# Patient Record
Sex: Male | Born: 1985 | Hispanic: Refuse to answer | Marital: Married | Smoking: Current some day smoker
Health system: Southern US, Community
[De-identification: ages and names within clinical notes are randomized; demographics above are authoritative.]

## PROBLEM LIST (undated history)

## (undated) DIAGNOSIS — I10 Essential (primary) hypertension: Secondary | ICD-10-CM

## (undated) DIAGNOSIS — E669 Obesity, unspecified: Secondary | ICD-10-CM

## (undated) HISTORY — DX: Obesity, unspecified: E66.9

## (undated) HISTORY — PX: CHOLECYSTECTOMY: SHX55

## (undated) HISTORY — DX: Essential (primary) hypertension: I10

---

## 2006-04-15 ENCOUNTER — Emergency Department (HOSPITAL_COMMUNITY): Admission: EM | Admit: 2006-04-15 | Discharge: 2006-04-15 | Payer: Self-pay | Admitting: Emergency Medicine

## 2010-02-16 ENCOUNTER — Emergency Department (HOSPITAL_COMMUNITY): Admission: EM | Admit: 2010-02-16 | Discharge: 2010-02-16 | Payer: Self-pay | Admitting: Emergency Medicine

## 2010-06-24 ENCOUNTER — Inpatient Hospital Stay (HOSPITAL_COMMUNITY): Admission: EM | Admit: 2010-06-24 | Discharge: 2010-06-28 | Payer: Self-pay | Admitting: Emergency Medicine

## 2010-06-27 ENCOUNTER — Encounter (INDEPENDENT_AMBULATORY_CARE_PROVIDER_SITE_OTHER): Payer: Self-pay | Admitting: General Surgery

## 2010-07-02 ENCOUNTER — Emergency Department (HOSPITAL_COMMUNITY): Admission: EM | Admit: 2010-07-02 | Discharge: 2010-07-02 | Payer: Self-pay | Admitting: Emergency Medicine

## 2011-02-05 LAB — DIFFERENTIAL
Basophils Absolute: 0 10*3/uL (ref 0.0–0.1)
Eosinophils Absolute: 0.1 10*3/uL (ref 0.0–0.7)
Lymphocytes Relative: 15 % (ref 12–46)
Lymphs Abs: 1.8 10*3/uL (ref 0.7–4.0)
Neutrophils Relative %: 78 % — ABNORMAL HIGH (ref 43–77)

## 2011-02-05 LAB — CBC
HCT: 43.2 % (ref 39.0–52.0)
Hemoglobin: 17.2 g/dL — ABNORMAL HIGH (ref 13.0–17.0)
MCH: 29.8 pg (ref 26.0–34.0)
MCHC: 34.2 g/dL (ref 30.0–36.0)
MCV: 89.4 fL (ref 78.0–100.0)
Platelets: 133 10*3/uL — ABNORMAL LOW (ref 150–400)
RBC: 5.23 MIL/uL (ref 4.22–5.81)
RDW: 13.1 % (ref 11.5–15.5)
RDW: 13.2 % (ref 11.5–15.5)
WBC: 11.3 10*3/uL — ABNORMAL HIGH (ref 4.0–10.5)
WBC: 12.4 10*3/uL — ABNORMAL HIGH (ref 4.0–10.5)
WBC: 7.2 10*3/uL (ref 4.0–10.5)

## 2011-02-05 LAB — URINALYSIS, ROUTINE W REFLEX MICROSCOPIC
Bilirubin Urine: NEGATIVE
Glucose, UA: NEGATIVE mg/dL
Hgb urine dipstick: NEGATIVE
Ketones, ur: NEGATIVE mg/dL
Nitrite: NEGATIVE
Protein, ur: NEGATIVE mg/dL
Specific Gravity, Urine: 1.01 (ref 1.005–1.030)
Urobilinogen, UA: 1 mg/dL (ref 0.0–1.0)
pH: 5.5 (ref 5.0–8.0)

## 2011-02-05 LAB — COMPREHENSIVE METABOLIC PANEL
ALT: 352 U/L — ABNORMAL HIGH (ref 0–53)
ALT: 462 U/L — ABNORMAL HIGH (ref 0–53)
Albumin: 4.1 g/dL (ref 3.5–5.2)
Alkaline Phosphatase: 119 U/L — ABNORMAL HIGH (ref 39–117)
BUN: 10 mg/dL (ref 6–23)
BUN: 9 mg/dL (ref 6–23)
CO2: 27 mEq/L (ref 19–32)
Calcium: 8.8 mg/dL (ref 8.4–10.5)
Calcium: 9 mg/dL (ref 8.4–10.5)
Calcium: 9.4 mg/dL (ref 8.4–10.5)
Creatinine, Ser: 0.73 mg/dL (ref 0.4–1.5)
Creatinine, Ser: 0.84 mg/dL (ref 0.4–1.5)
GFR calc non Af Amer: >60 mL/min (ref >60)
Glucose, Bld: 146 mg/dL — ABNORMAL HIGH (ref 70–99)
Potassium: 3.9 mEq/L (ref 3.5–5.1)
Potassium: 4 mEq/L (ref 3.5–5.1)
Sodium: 138 mEq/L (ref 135–145)
Sodium: 140 mEq/L (ref 135–145)
Total Bilirubin: 1 mg/dL (ref 0.3–1.2)
Total Bilirubin: 3.1 mg/dL — ABNORMAL HIGH (ref 0.3–1.2)
Total Protein: 6 g/dL (ref 6.0–8.3)
Total Protein: 6.4 g/dL (ref 6.0–8.3)

## 2011-02-05 LAB — HEPATIC FUNCTION PANEL
ALT: 136 U/L — ABNORMAL HIGH (ref 0–53)
ALT: 188 U/L — ABNORMAL HIGH (ref 0–53)
AST: 39 U/L — ABNORMAL HIGH (ref 0–37)
AST: 49 U/L — ABNORMAL HIGH (ref 0–37)
Albumin: 3.7 g/dL (ref 3.5–5.2)
Alkaline Phosphatase: 82 U/L (ref 39–117)
Alkaline Phosphatase: 97 U/L (ref 39–117)
Bilirubin, Direct: <0.1 mg/dL (ref 0.0–0.3)
Indirect Bilirubin: 0.9 mg/dL (ref 0.3–0.9)
Total Bilirubin: 0.4 mg/dL (ref 0.3–1.2)
Total Protein: 6.4 g/dL (ref 6.0–8.3)

## 2011-02-05 LAB — AMYLASE: Amylase: 229 U/L — ABNORMAL HIGH (ref 0–105)

## 2011-02-05 LAB — LIPID PANEL
HDL: 37 mg/dL — ABNORMAL LOW (ref >39)
Total CHOL/HDL Ratio: 3.5 RATIO
Triglycerides: 104 mg/dL (ref <150)
VLDL: 21 mg/dL (ref 0–40)

## 2011-02-05 LAB — LIPASE, BLOOD
Lipase: 6506 U/L — ABNORMAL HIGH (ref 11–59)
Lipase: 81 U/L — ABNORMAL HIGH (ref 11–59)

## 2011-02-05 LAB — GLUCOSE, CAPILLARY: Glucose-Capillary: 92 mg/dL (ref 70–99)

## 2011-02-15 LAB — DIFFERENTIAL
Basophils Absolute: 0 10*3/uL (ref 0.0–0.1)
Basophils Relative: 0 % (ref 0–1)
Lymphocytes Relative: 29 % (ref 12–46)
Monocytes Absolute: 0.6 10*3/uL (ref 0.1–1.0)
Neutro Abs: 6.7 10*3/uL (ref 1.7–7.7)
Neutrophils Relative %: 64 % (ref 43–77)

## 2011-02-15 LAB — CBC
HCT: 48.2 % (ref 39.0–52.0)
Hemoglobin: 16.4 g/dL (ref 13.0–17.0)
MCV: 90.2 fL (ref 78.0–100.0)
Platelets: 162 10*3/uL (ref 150–400)
RDW: 13.4 % (ref 11.5–15.5)

## 2011-02-15 LAB — COMPREHENSIVE METABOLIC PANEL
Albumin: 4.2 g/dL (ref 3.5–5.2)
BUN: 17 mg/dL (ref 6–23)
Chloride: 102 mEq/L (ref 96–112)
Creatinine, Ser: 0.88 mg/dL (ref 0.4–1.5)
Glucose, Bld: 114 mg/dL — ABNORMAL HIGH (ref 70–99)
Total Bilirubin: 0.6 mg/dL (ref 0.3–1.2)
Total Protein: 7.5 g/dL (ref 6.0–8.3)

## 2011-02-15 LAB — URINALYSIS, ROUTINE W REFLEX MICROSCOPIC
Glucose, UA: NEGATIVE mg/dL
Hgb urine dipstick: NEGATIVE
Ketones, ur: NEGATIVE mg/dL
Protein, ur: NEGATIVE mg/dL

## 2011-10-28 IMAGING — CT CT ABD-PELV W/ CM
2 of 4 series · 16 of 46 positions shown, 18 images · IV contrast (water & 100ml omni 300)
Comparison: Ultrasound abdomen of 02/16/2010

CLINICAL DATA: Right upper quadrant pain, fever, nausea and
vomiting

CT ABDOMEN AND PELVIS WITH CONTRAST
TECHNIQUE: Multidetector CT imaging of the abdomen and pelvis was
performed following the standard protocol during bolus
administration of intravenous contrast.
Contrast: 100 ml Fmnipaque-4RR

[Series 2: routine abdomen · axial · 0.88mm/px · z∈[-459,+6]mm · 13 of 103 slices shown, 15 images]
[im 5/103  soft-tissue]
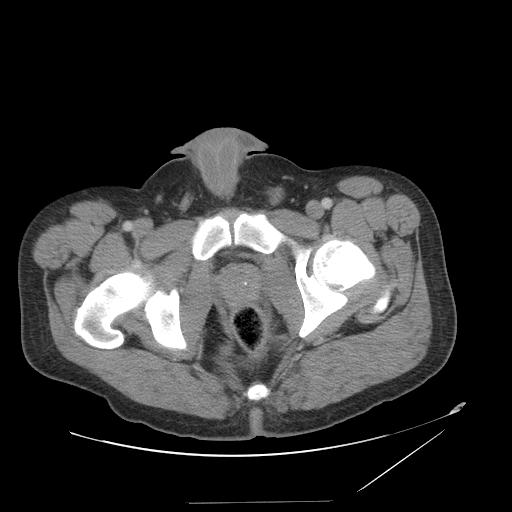
[im 5/103  bone]
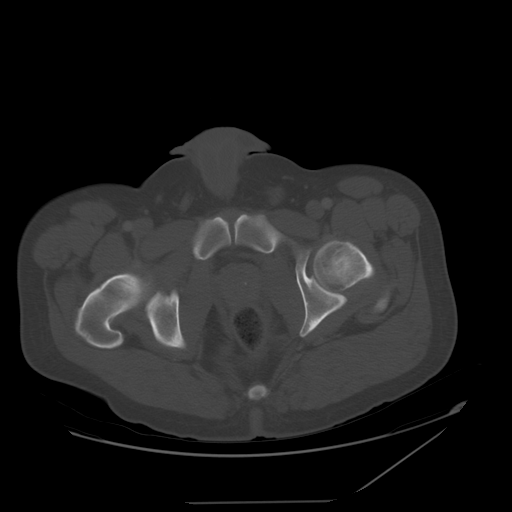
[im 14/103  soft-tissue]
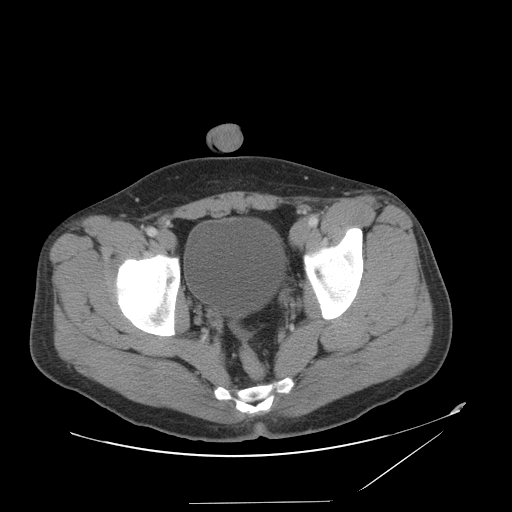
[im 23/103  soft-tissue]
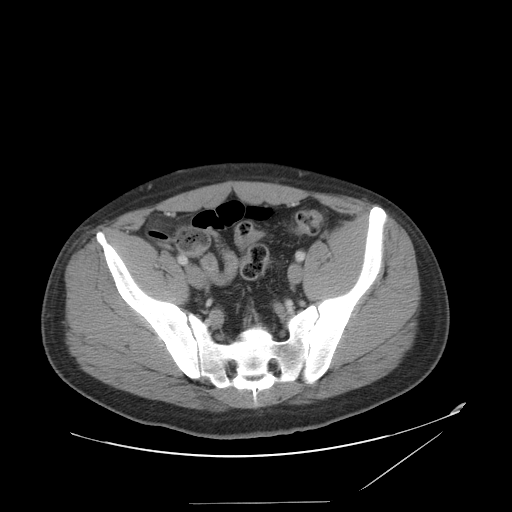
[im 27/103  soft-tissue]
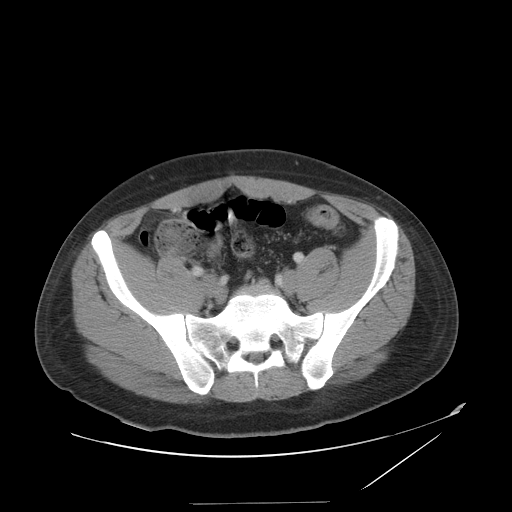
[im 36/103  soft-tissue]
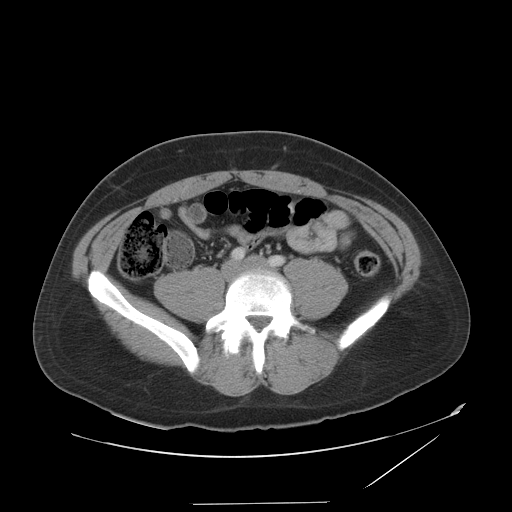
[im 45/103  soft-tissue]
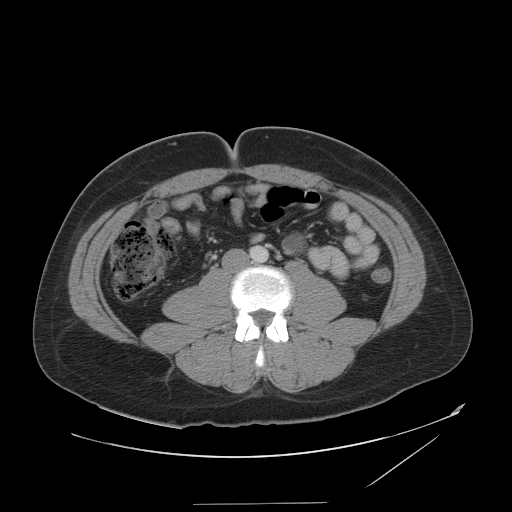
[im 54/103  soft-tissue]
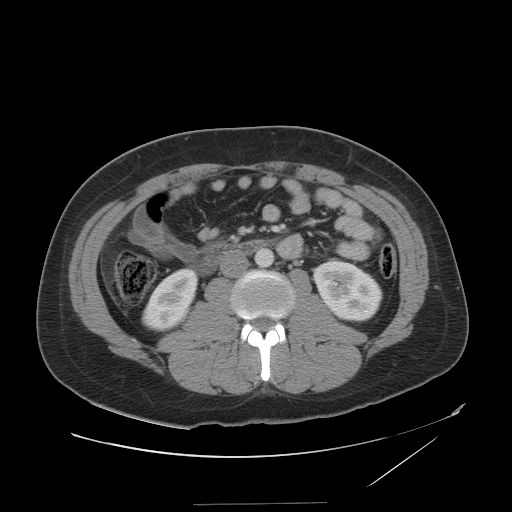
[im 58/103  soft-tissue]
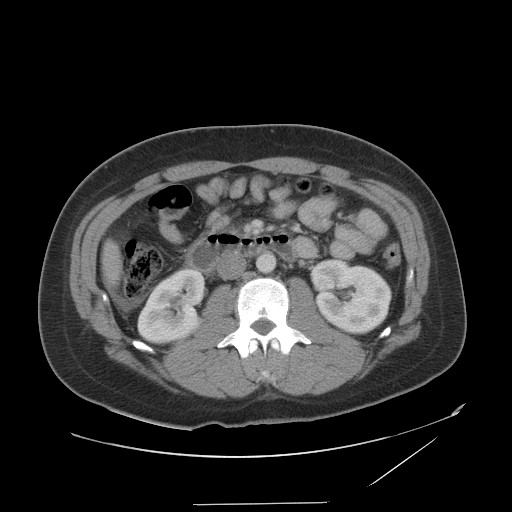
[im 67/103  soft-tissue]
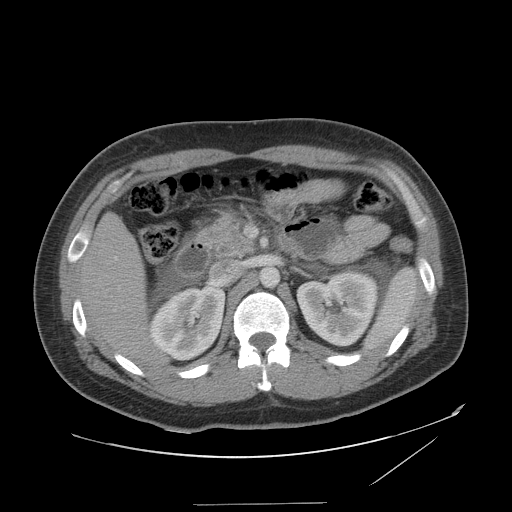
[im 67/103  bone]
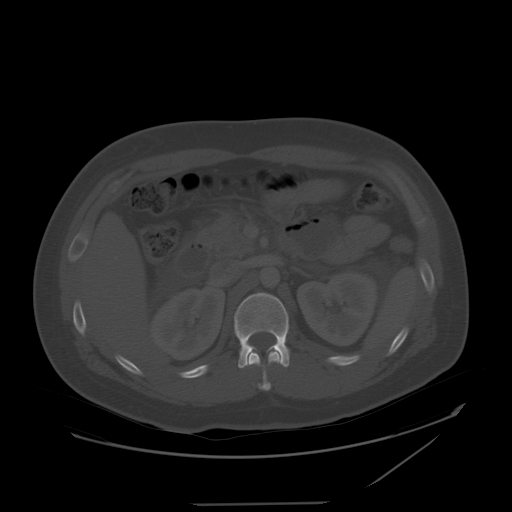
[im 76/103  soft-tissue]
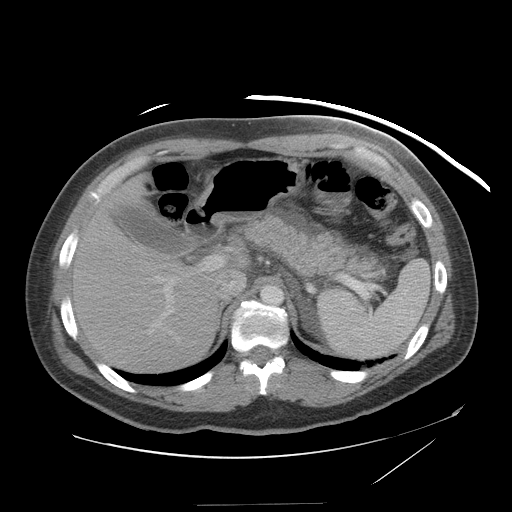
[im 80/103  soft-tissue]
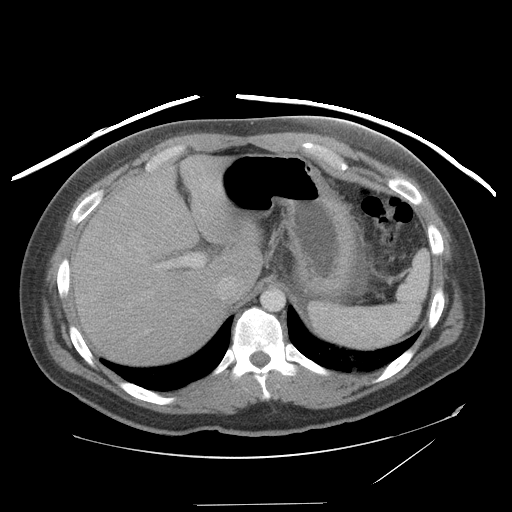
[im 89/103  soft-tissue]
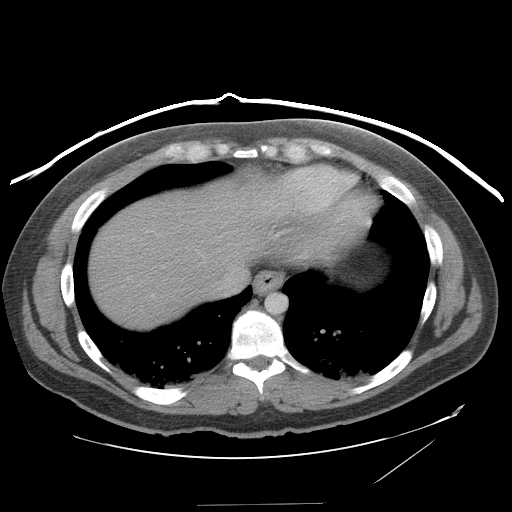
[im 98/103  soft-tissue]
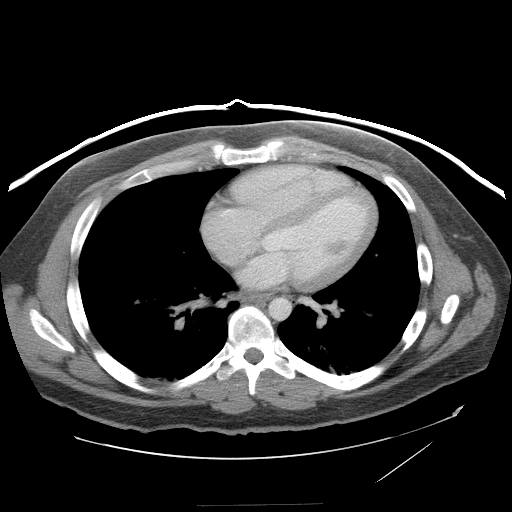

[Series 400: coronal a/p · coronal · 0.90mm/px · 3 of 134 slices shown]
[im 45/134  soft-tissue]
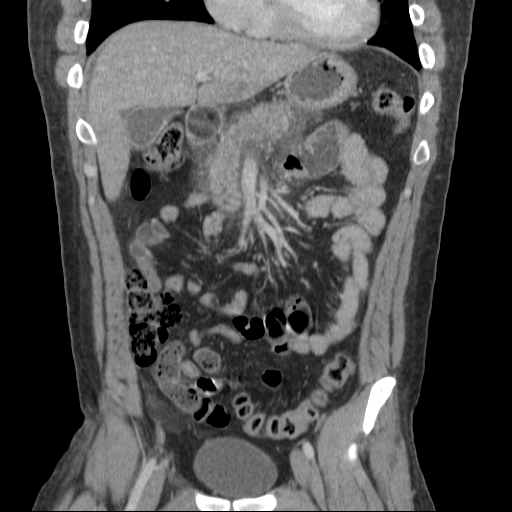
[im 60/134  soft-tissue]
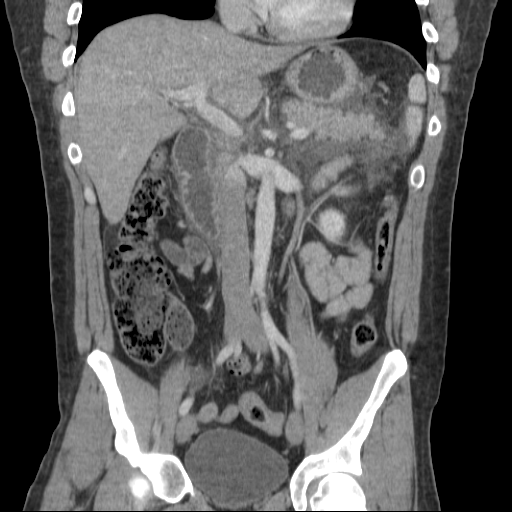
[im 74/134  soft-tissue]
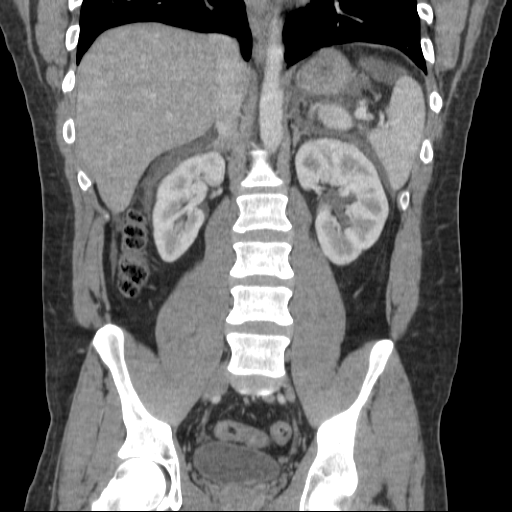

[16 of 46 positions shown; findings below may reference images not displayed]

FINDINGS: Streaky opacities in both lung bases most likely reflect
atelectasis although early pneumonia cannot be excluded.  No
effusion is seen.  The liver enhances with no focal abnormality and
no ductal dilatation is seen.  Small calcified gallstones are noted
in the gallbladder.  There is considerable exudate surrounding the
pancreas most consistent with changes of acute pancreatitis with
fluid extending into the anterior pararenal spaces bilaterally.
The pancreatic duct is not dilated.  Correlation with laboratory
values is recommended to confirm.  No definite calculus is seen
within the common bile duct and the common bile duct is not appear
to be dilated. There is a rounded opacity within the descending
duodenum which could be injected through but could also represent a
recently passed calculus. The adrenal glands and spleen are
unremarkable.  The stomach is fluid distended and unremarkable.
The kidneys enhance with no calculus or mass and no hydronephrosis
is seen.  The aorta is normal in caliber.  The origins of the
celiac axis, SMA, renal arteries and IMA are patent.

The appendix is well visualized in the right lower quadrant and
appears normal.  The terminal ileum is fluid filled and
unremarkable.  The urinary bladder is unremarkable.  The prostate
is normal in size.  A small amount of free fluid layers dependently
within the pelvis.  No abnormality of the colon is seen.  No bony
abnormality is seen
IMPRESSION: 1.  Considerable peripancreatic exudate most consistent with
changes of acute pancreatitis with fluid extending into the
anterior pararenal spaces bilaterally.
2.  Gallstones.  Cannot exclude a recently passed calculus within
the descending duodenum.
3.  Linear atelectasis or early pneumonia in the lower lobes
posteriorly.

## 2011-10-28 IMAGING — US US ABDOMEN COMPLETE
1 series · 13 of 25 positions shown · non-contrast
Comparison: CT abdomen pelvis of 06/24/2010

CLINICAL DATA: Abdominal pain

COMPLETE ABDOMINAL ULTRASOUND

[Series 1: us abdomen complete · 0.27mm/px · 13 of 66 slices shown]
[im 1/66]
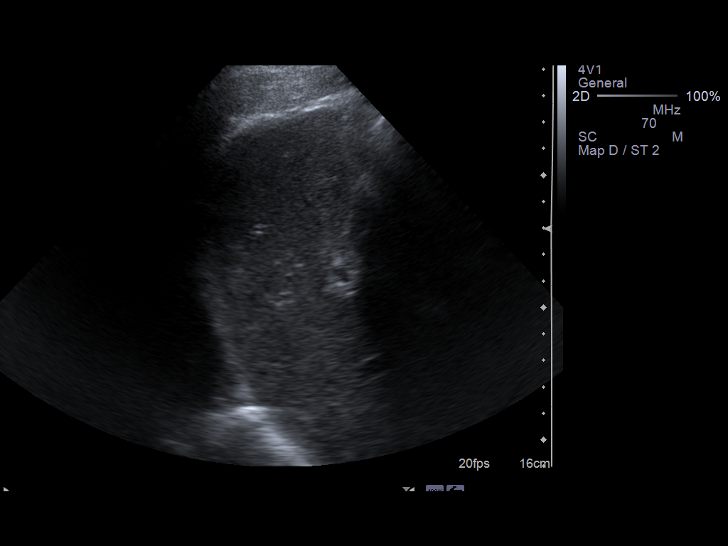
[im 6/66]
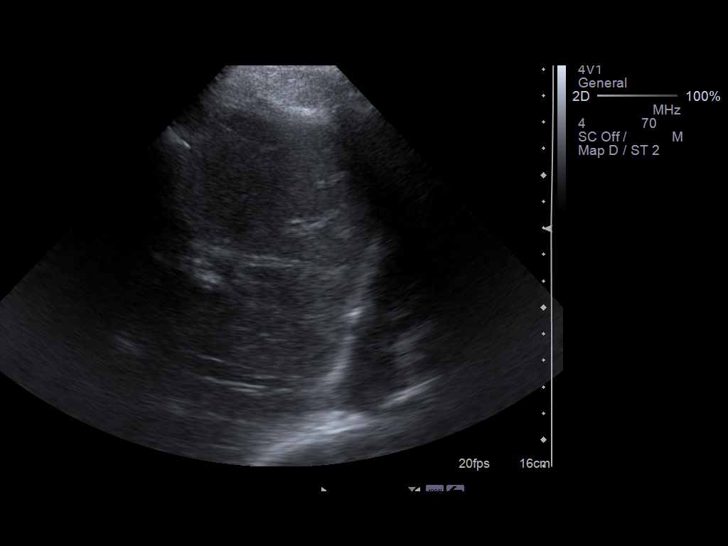
[im 11/66]
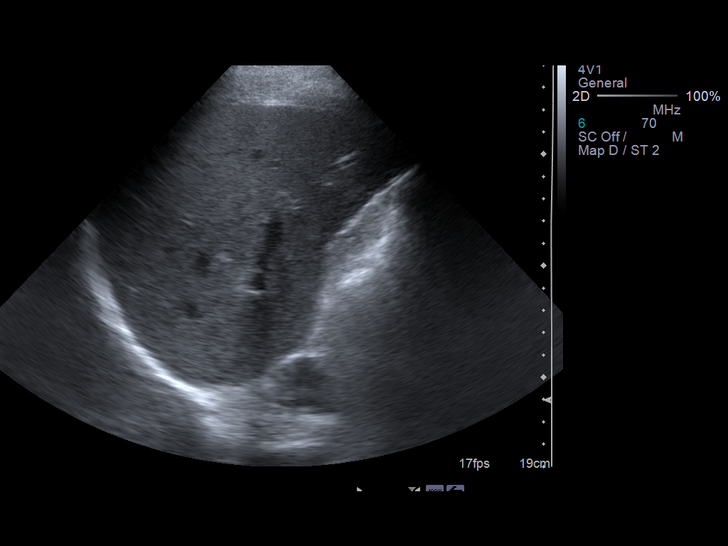
[im 17/66]
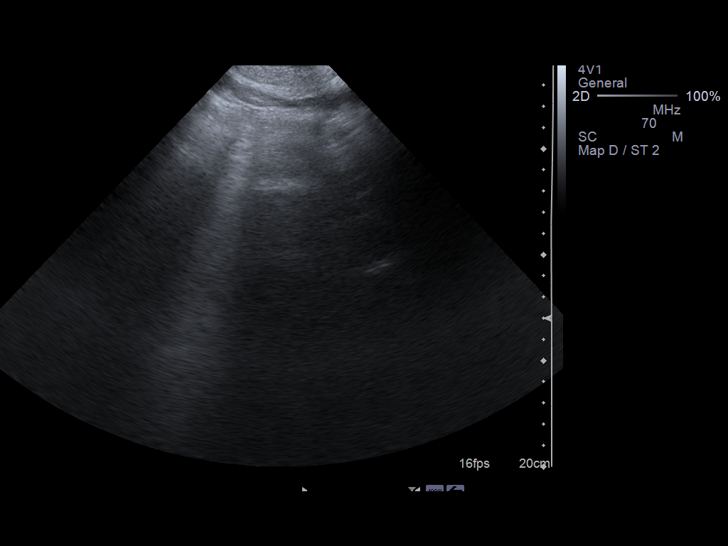
[im 22/66]
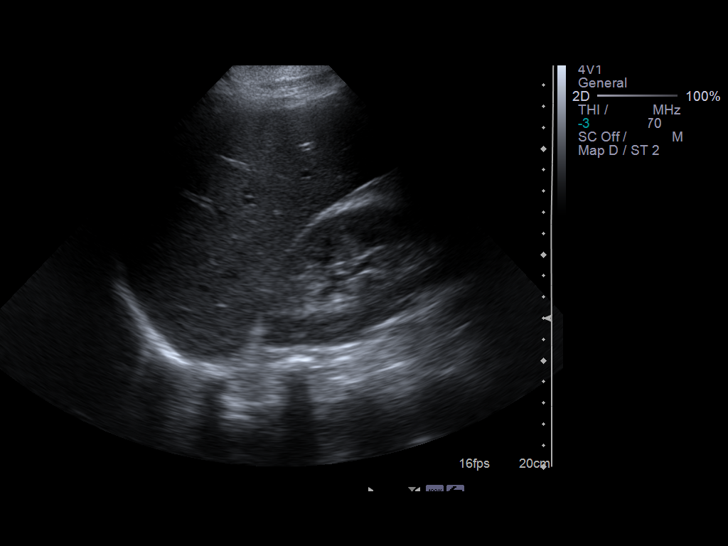
[im 28/66]
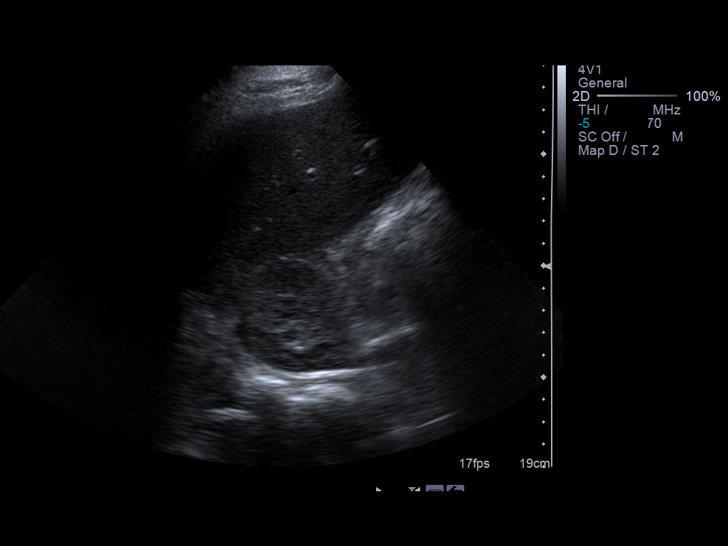
[im 33/66]
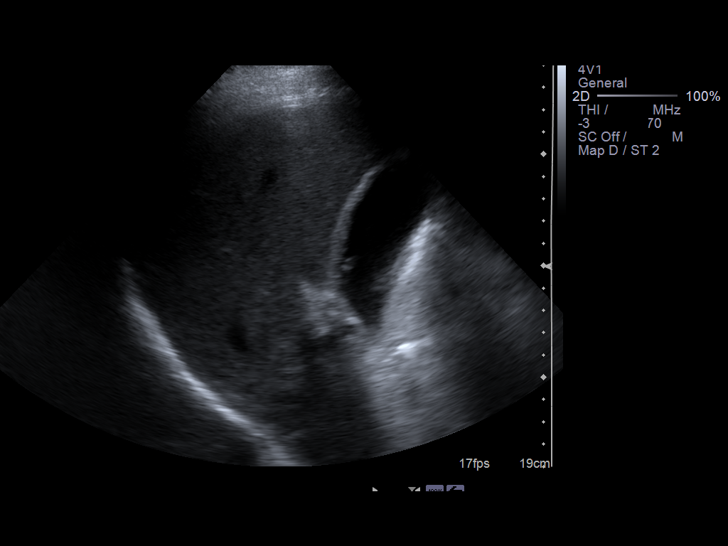
[im 38/66]
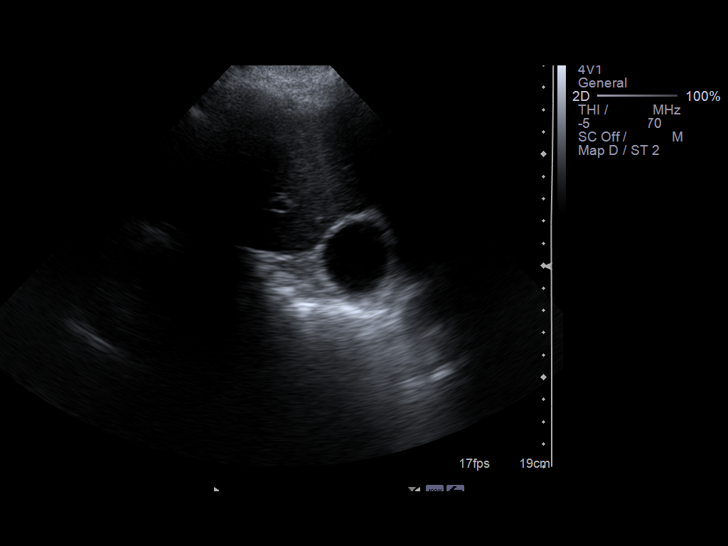
[im 44/66]
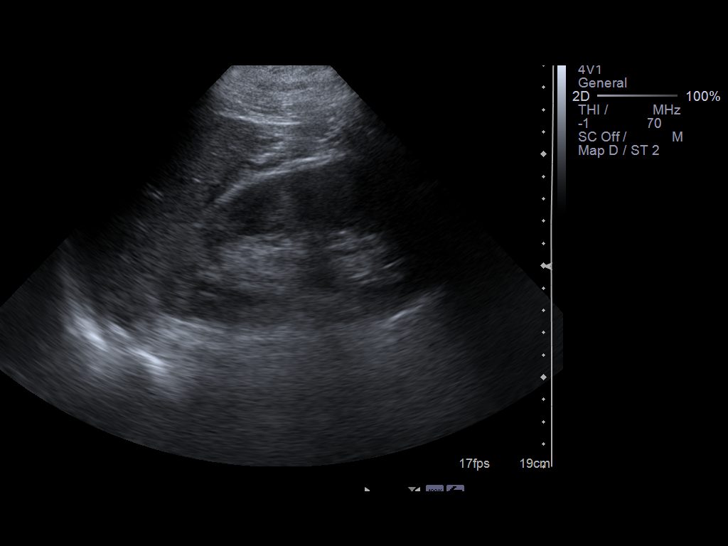
[im 49/66]
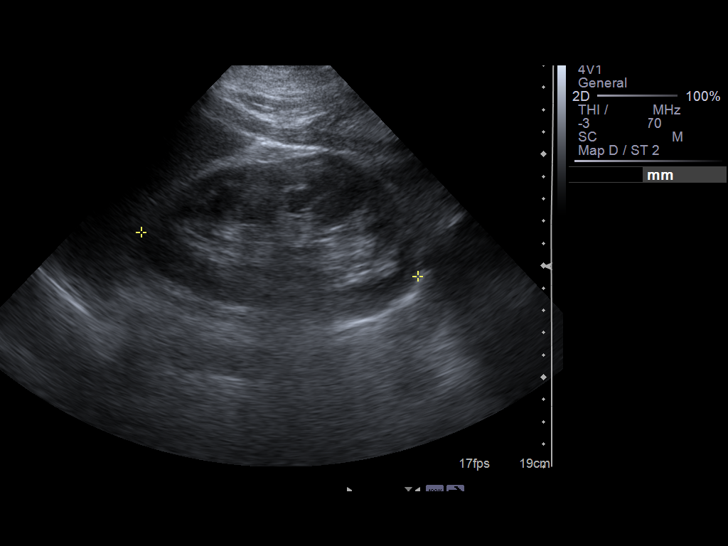
[im 55/66]
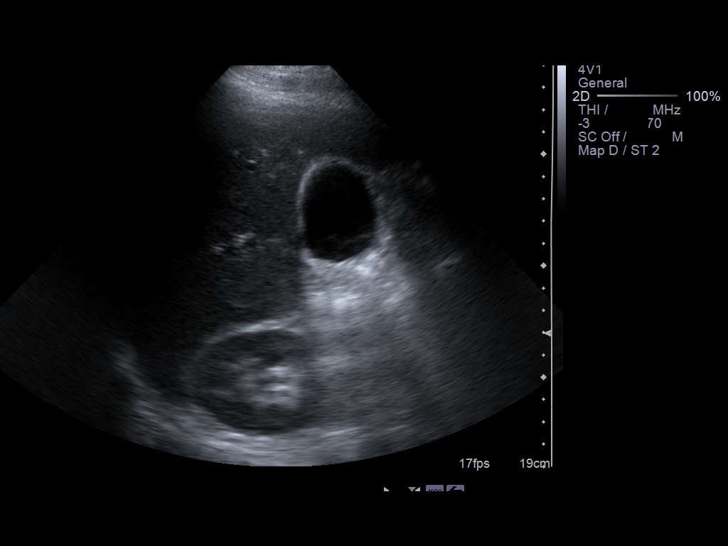
[im 60/66]
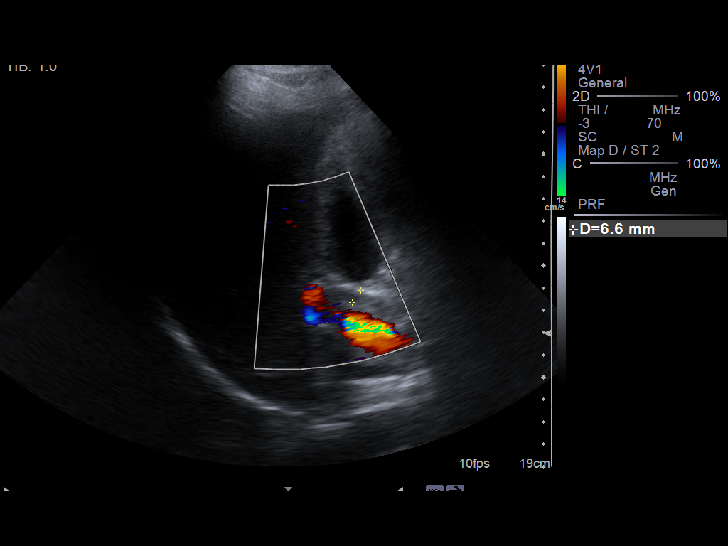
[im 66/66]
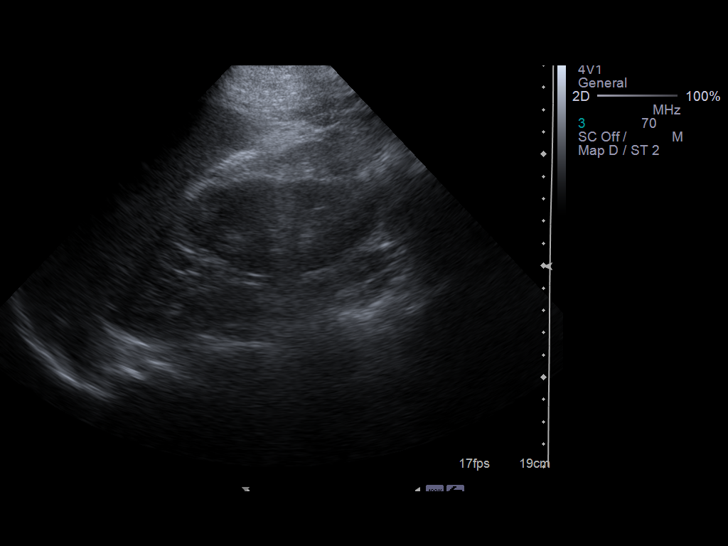

[13 of 25 positions shown; findings below may reference images not displayed]

FINDINGS: Gallbladder:  The gallbladder is visualized and there are small
gallstones present.  No gallbladder wall thickening is seen and
there is no pain over the gallbladder with compression.

Common bile duct:  The common bile duct is slightly prominent
measuring 6.6 mm which is within upper limits of normal for age.

Liver:  The liver has a normal echogenic pattern.  No ductal
dilatation is seen.

IVC:  The IVC is partially obscured by bowel gas.

Pancreas:  The pancreas is completely obscured by bowel gas and
cannot be evaluated.

Spleen:  The spleen is normal in size measuring 6.7 cm sagittally.

Right Kidney:  No hydronephrosis is seen.  The right kidney
measures 12.3 cm sagittally.

Left Kidney:  No hydronephrosis.  The left kidney measures 12.6 cm.

Abdominal aorta:  Much of the abdominal aorta is obscured by bowel
gas.
IMPRESSION: 1.  Small gallstones.  No present evidence of acute cholecystitis.
2.  The pancreas and portions of the abdominal aorta are completely
obscured by bowel gas.
3.  The common bile duct is within upper limits of normal for age.

## 2011-10-31 IMAGING — RF DG CHOLANGIOGRAM OPERATIVE
1 series · 7 of 7 positions shown · non-contrast
Comparison: None

CLINICAL DATA: Gallstone pancreatitis

INTRAOPERATIVE CHOLANGIOGRAM
TECHNIQUE: Cholangiographic images from the C-arm fluoroscopic
device were submitted for interpretation post-operatively.  Please
see the procedural report for the amount of contrast and the
fluoroscopy time utilized.

[Series 1: run · 2 acquisitions, 7 frames shown]
[im 1/2]
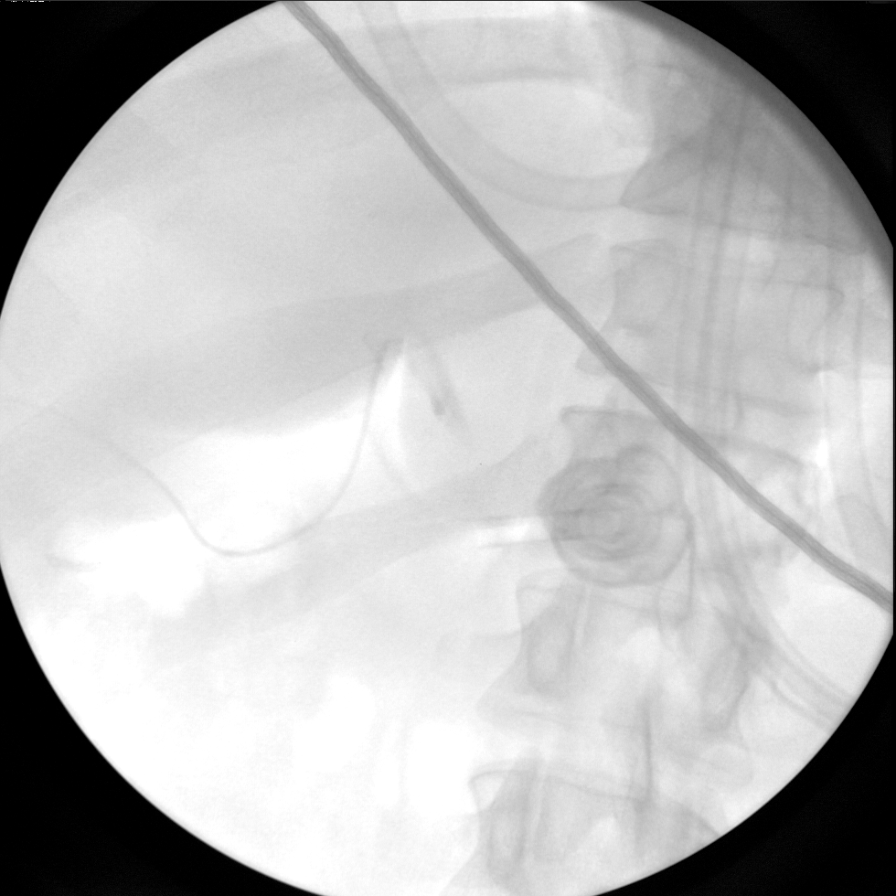
[im 1/2]
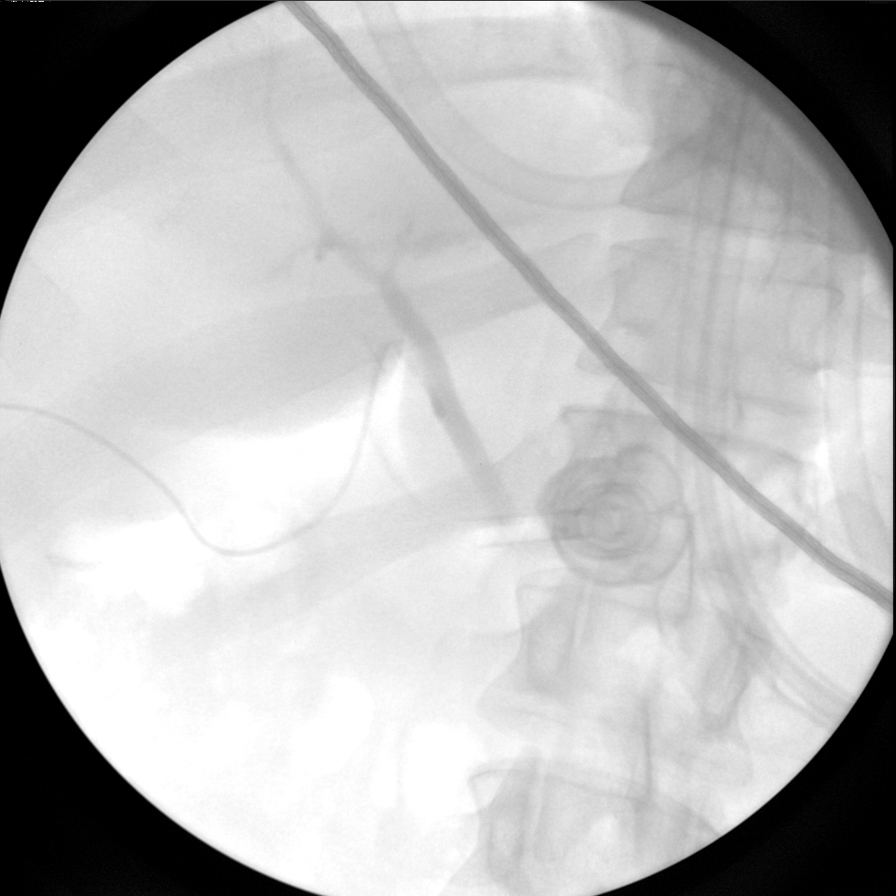
[im 1/2]
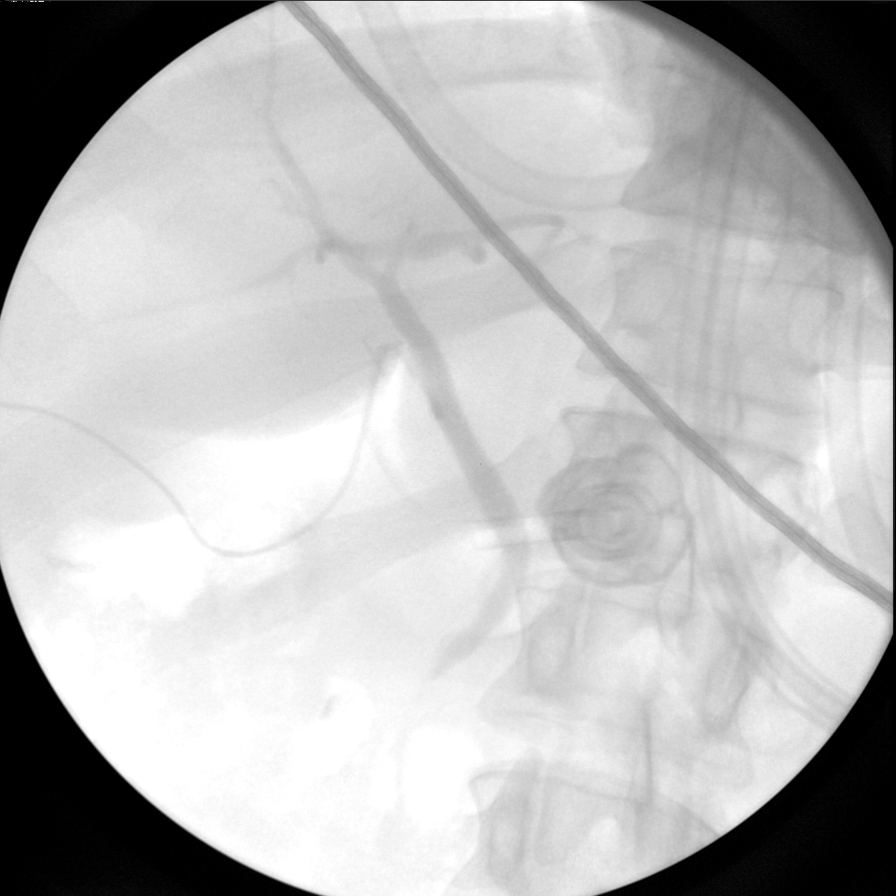
[im 2/2]
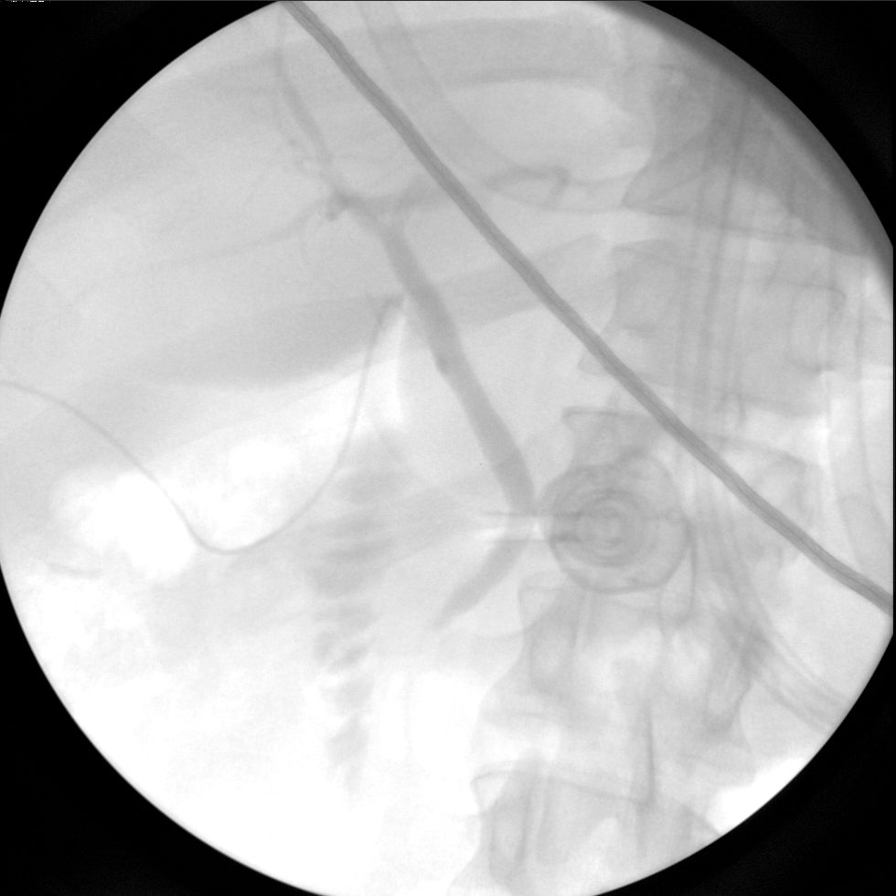
[im 2/2]
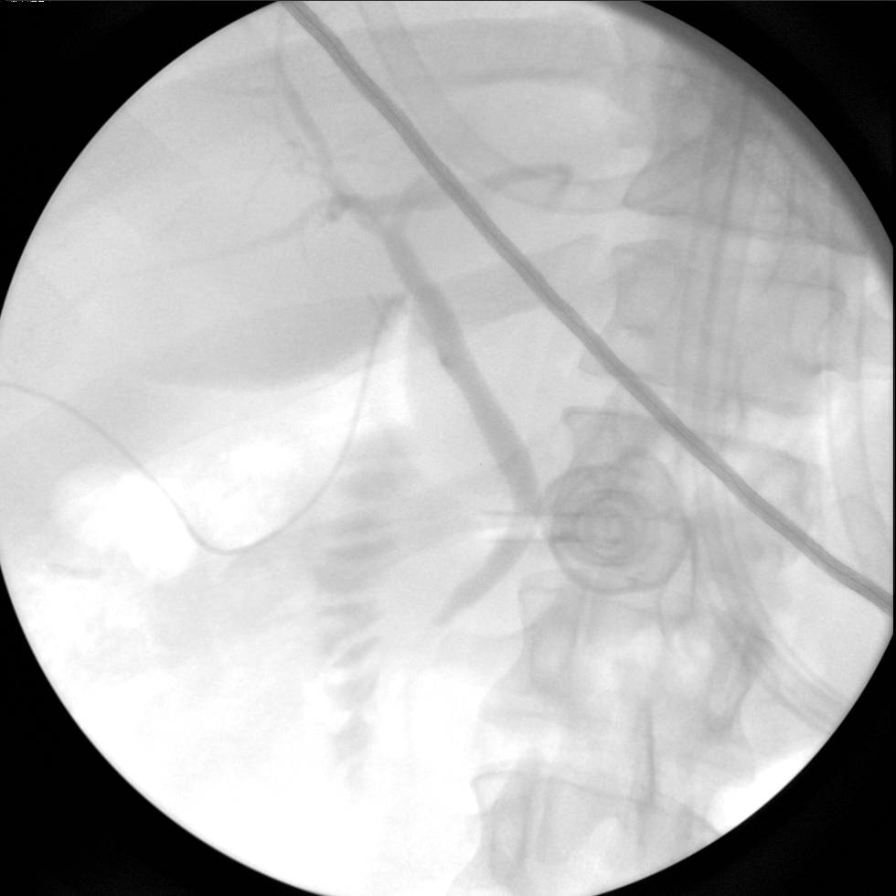
[im 2/2]
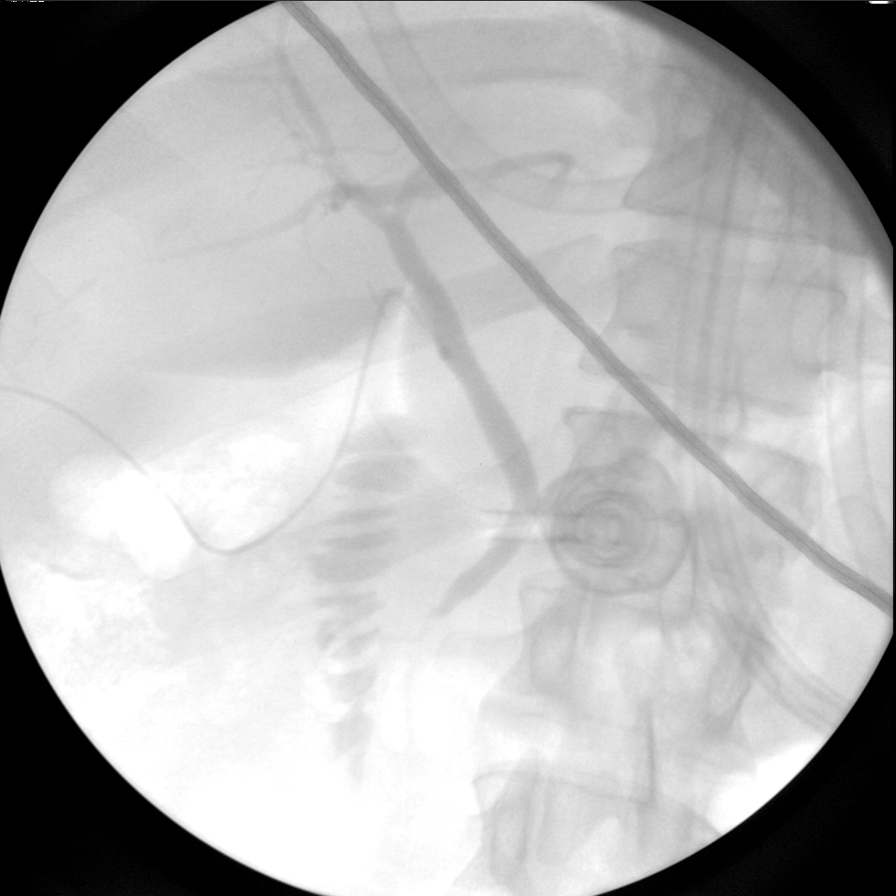
[im 2/2]
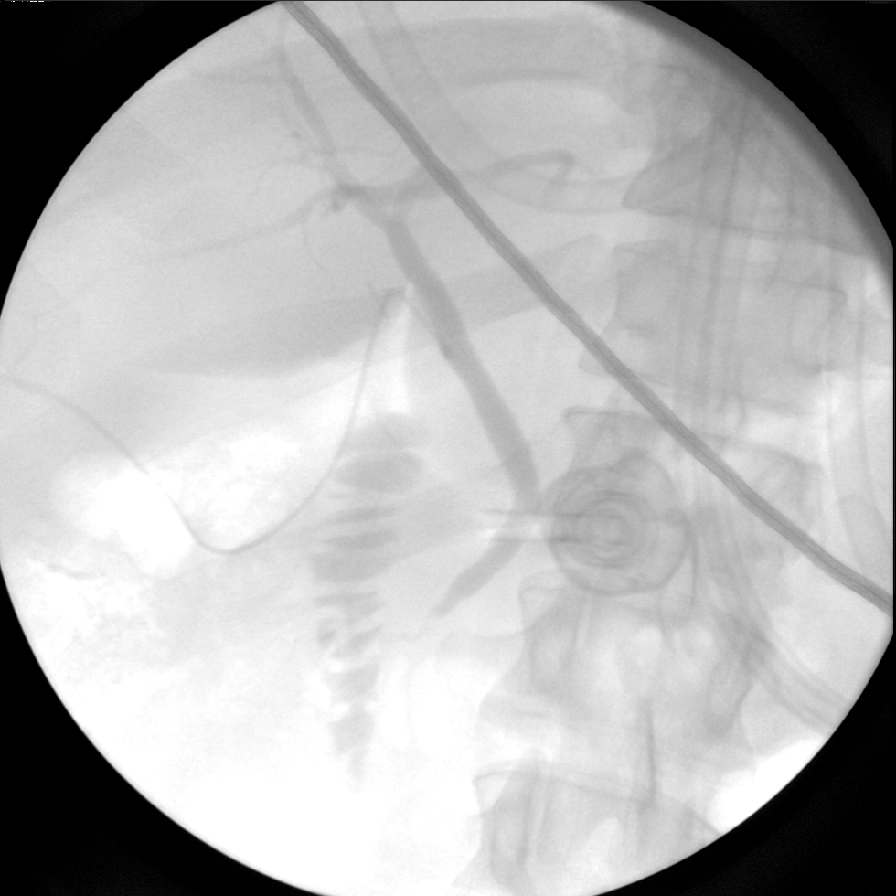

[7 of 7 positions shown; findings below may reference images not displayed]

FINDINGS: No persistent filling defects in the common duct.
Intrahepatic ducts are incompletely visualized, appearing
decompressed centrally. Contrast passes into the duodenum.

IMPRESSION

Negative for retained common duct stone.

## 2015-09-23 ENCOUNTER — Encounter: Payer: Self-pay | Admitting: Neurology

## 2015-09-23 ENCOUNTER — Institutional Professional Consult (permissible substitution): Payer: BLUE CROSS/BLUE SHIELD | Admitting: Neurology

## 2015-09-23 ENCOUNTER — Ambulatory Visit (INDEPENDENT_AMBULATORY_CARE_PROVIDER_SITE_OTHER): Payer: BLUE CROSS/BLUE SHIELD | Admitting: Neurology

## 2015-09-23 VITALS — BP 138/94 | HR 92 | Resp 20 | Ht 75.0 in | Wt 340.0 lb

## 2015-09-23 DIAGNOSIS — G4733 Obstructive sleep apnea (adult) (pediatric): Secondary | ICD-10-CM | POA: Diagnosis not present

## 2015-09-23 DIAGNOSIS — G478 Other sleep disorders: Secondary | ICD-10-CM | POA: Diagnosis not present

## 2015-09-23 DIAGNOSIS — G4719 Other hypersomnia: Secondary | ICD-10-CM

## 2015-09-23 DIAGNOSIS — R351 Nocturia: Secondary | ICD-10-CM

## 2015-09-23 NOTE — Patient Instructions (Signed)

## 2015-09-23 NOTE — Progress Notes (Signed)
Subjective:    Patient ID: Jack Cabrera is a 29 y.o. male.  HPI     Huston Foley, MD, PhD [facility] [address] [address] [place], [place] [address]  Dear Dr. Welton Flakes,    I saw your patient, Jack Cabrera, upon your kind request in my neurologic clinic today for initial consultation of his sleep disorder, in particular, concern for underlying obstructive sleep apnea. The patient is unaccompanied today. As you know, Jack Cabrera is a 29 year old right-handed gentleman with an underlying medical history of hypertension, recent smoking cessation, and severe obesity, who reports snoring and excessive daytime somnolence as well as witnessed apneic pauses while asleep and occasional sense of gasping for air. He is a light sleeper. He does not wake up rested.  He has a Agricultural consultant.  I reviewed your office note from 08/22/2015.  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 Unfortunately, because of the lifestyle he has gained a lot of weight around 70 pounds in the last year. His father has sleep apnea and is on CPAP. The patient would be willing to try CPAP. He denies restless leg symptoms. He denies morning headaches but has nocturia about once or twice per night on average. He drinks a lot of caffeine in the form of coffee throughout the day. 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 He has been driving all across the country. He is in the process of changing job to the [employer] and will be driving from here to [place] and back. He will be able to stay overnight and then drive back. He has recently quit smoking a month ago. He drinks alcohol very infrequently. He does not use illicit drugs. He is single and engaged to be married. He has no children. He has a bedtime of around 2 AM typically, rise time is between noon and 1 PM. He  has an Epworth sleepiness score of 5 out of 24 today, his fatigue score is 23 out of 63 today. He reports less sleepiness since he is on caffeine. He takes melatonin about 6 mg at night for sleep.   His Past Medical History Is Significant For: Past Medical History  Diagnosis Date  . Obesity   . Hypertension     His Past Surgical History Is Significant For: Past Surgical History  Procedure Laterality Date  . Cholecystectomy      His Family History Is Significant For: Family History  Problem Relation Age of Onset  . Hypertension Maternal Grandfather   . Diabetes Paternal Grandmother     His Social History Is Significant For: Social History   Social History  . Marital Status: Divorced    Spouse Name: N/A  . Number of Children: 0  . Years of Education: college   Occupational History  . [employer]     Social History Main Topics  . Smoking status: Former Games developer  . Smokeless tobacco: None     Comment: Quit 08/23/2015  . Alcohol Use: 0.0 oz/week    0 Standard drinks or equivalent per week  . Drug Use: No  . Sexual Activity: Not Asked   Other Topics Concern  . None   Social History Narrative    His Allergies Are:  No Known Allergies:   His Current Medications Are:  Outpatient Encounter  Prescriptions as of 09/23/2015  Medication Sig  . lisinopril-hydrochlorothiazide (PRINZIDE,ZESTORETIC) 10-12.5 MG tablet Take 1 tablet by mouth daily.   No facility-administered encounter medications on file as of 09/23/2015.  :  Review of Systems:  Out of a complete 14 point review of systems, all are reviewed and negative with the exception of these symptoms as listed below:   Review of Systems  Neurological:       Drives a truck for a job. Sometimes takes Melatonin to fall asleep, trouble staying asleep, witnessed apnea, wakes up feeling tired, drinks mostly coffee to stay awake during day.   Epworth Sleepiness Scale 0= would never doze 1= slight chance of dozing 2=  moderate chance of dozing 3= high chance of dozing  Sitting and reading:1 Watching TV:1 Sitting inactive in a public place (ex. Theater or meeting):0 As a passenger in a car for an hour without a break:0 Lying down to rest in the afternoon:2 Sitting and talking to someone:0 Sitting quietly after lunch (no alcohol):1 In a car, while stopped in traffic:0 Total:5  Objective:  Neurologic Exam  Physical Exam Physical Examination:   Filed Vitals:   09/23/15 1514  BP: 138/94  Pulse: 92  Resp: 20    General Examination: The patient is a very pleasant 29 y.o. male in no acute distress. He appears well-developed and well-nourished and well groomed.   HEENT: Normocephalic, atraumatic, pupils are equal, round and reactive to light and accommodation. Funduscopic exam is normal with sharp disc margins noted. Extraocular tracking is good without limitation to gaze excursion or nystagmus noted. Normal smooth pursuit is noted. Hearing is grossly intact. Face is symmetric with normal facial animation and normal facial sensation. Speech is clear with no dysarthria noted. There is no hypophonia. There is no lip, neck/head, jaw or voice tremor. Neck is supple with full range of passive and active motion. There are no carotid bruits on auscultation. Oropharynx exam reveals: mild mouth dryness, good dental hygiene and moderate airway crowding, due to large tongue. Mallampati is class II. Tongue protrudes centrally and palate elevates symmetrically. Tonsils are , tonsils of 2+, large uvula. Neck circumference is 19-3/4 inches.  Chest: Clear to auscultation without wheezing, rhonchi or crackles noted.  Heart: S1+S2+0, regular and normal without murmurs, rubs or gallops noted.   Abdomen: Soft, non-tender and non-distended with normal bowel sounds appreciated on auscultation.  Extremities: There is no pitting edema in the distal lower extremities bilaterally. Pedal pulses are intact.  Skin: Warm and dry  without trophic changes noted. There are no varicose veins.  Musculoskeletal: exam reveals no obvious joint deformities, tenderness or joint swelling or erythema.   Neurologically:  Mental status: The patient is awake, alert and oriented in all 4 spheres. His immediate and remote memory, attention, language skills and fund of knowledge are appropriate. There is no evidence of aphasia, agnosia, apraxia or anomia. Speech is clear with normal prosody and enunciation. Thought process is linear. Mood is normal and affect is normal.  Cranial nerves II - XII are as described above under HEENT exam. In addition: shoulder shrug is normal with equal shoulder height noted. Motor exam: Normal bulk, strength and tone is noted. There is no drift, tremor or rebound. Romberg is negative. Reflexes are 2+ throughout. Fine motor skills and coordination: intact with normal finger taps, normal hand movements, normal rapid alternating patting, normal foot taps and normal foot agility.  Cerebellar testing: No dysmetria or intention tremor on finger to nose testing. Heel to shin  is unremarkable bilaterally. There is no truncal or gait ataxia.  Sensory exam: intact to light touch, pinprick, vibration, temperature sense in the upper and lower extremities.  Gait, station and balance: He stands easily. No veering to one side is noted. No leaning to one side is noted. Posture is age-appropriate and stance is narrow based. Gait shows normal stride length and normal pace. No problems turning are noted. He turns en bloc. Tandem walk is unremarkable.   Assessment and Plan:  In summary, Maddex Garlitz is a very pleasant 29 y.o.-year old male with an underlying medical history of hypertension, recent smoking cessation, and severe obesity, whose history and physical exam are in keeping with obstructive sleep apnea (OSA). I had a long chat with the patient about my findings and the diagnosis of OSA, its prognosis and treatment options. We  talked about medical treatments, surgical interventions and non-pharmacological approaches. I explained in particular the risks and ramifications of untreated moderate to severe OSA, especially with respect to developing cardiovascular disease down the Road, including congestive heart failure, difficult to treat hypertension, cardiac arrhythmias, or stroke. Even type 2 diabetes has, in part, been linked to untreated OSA. Symptoms of untreated OSA include daytime sleepiness, memory problems, mood irritability and mood disorder such as depression and anxiety, lack of energy, as well as recurrent headaches, especially morning headaches. We talked about trying to maintain a healthy lifestyle in general, as well as the importance of weight control. I encouraged the patient to eat healthy, exercise daily and keep well hydrated, to keep a scheduled bedtime and wake time routine, to not skip any meals and eat healthy snacks in between meals. I advised the patient not to drive when feeling sleepy. I recommended the following at this time: sleep study with potential positive airway pressure titration. (We will score hypopneas at 3 and split the sleep study into diagnostic and treatment portion, if the estimated. 2 hour AHI is >15/h).   I explained the sleep test procedure to the patient and also outlined possible surgical and non-surgical treatment options of OSA, including the use of a custom-made dental device (which would require a referral to a specialist dentist or oral surgeon), upper airway surgical options, such as pillar implants, radiofrequency surgery, tongue base surgery, and UPPP (which would involve a referral to an ENT surgeon). Rarely, jaw surgery such as mandibular advancement may be considered.  I also explained the CPAP treatment option to the patient, who indicated that he would be willing to try CPAP if the need arises. I explained the importance of being compliant with PAP treatment, not only for  insurance purposes but primarily to improve His symptoms, and for the patient's long term health benefit, including to reduce His cardiovascular risks. I answered all his questions today and the patient was in agreement. I would like to see him back after the sleep study is completed and encouraged him to call with any interim questions, concerns, problems or updates.   Thank you very much for allowing me to participate in the care of this nice patient. If I can be of any further assistance to you please do not hesitate to call me at [phone].  Sincerely,   Huston Foley, MD, PhD

## 2015-09-25 ENCOUNTER — Ambulatory Visit (INDEPENDENT_AMBULATORY_CARE_PROVIDER_SITE_OTHER): Payer: BLUE CROSS/BLUE SHIELD | Admitting: Neurology

## 2015-09-25 DIAGNOSIS — G479 Sleep disorder, unspecified: Secondary | ICD-10-CM

## 2015-09-25 DIAGNOSIS — G4739 Other sleep apnea: Secondary | ICD-10-CM

## 2015-09-25 DIAGNOSIS — G4737 Central sleep apnea in conditions classified elsewhere: Secondary | ICD-10-CM

## 2015-09-25 DIAGNOSIS — G4733 Obstructive sleep apnea (adult) (pediatric): Secondary | ICD-10-CM | POA: Diagnosis not present

## 2015-09-25 DIAGNOSIS — Z9989 Dependence on other enabling machines and devices: Secondary | ICD-10-CM

## 2015-09-25 DIAGNOSIS — G4731 Primary central sleep apnea: Secondary | ICD-10-CM

## 2015-09-25 DIAGNOSIS — G4734 Idiopathic sleep related nonobstructive alveolar hypoventilation: Secondary | ICD-10-CM

## 2015-09-26 NOTE — Sleep Study (Signed)
Please see the scanned sleep study interpretation located in the procedure tab within the chart review section.   

## 2015-10-02 ENCOUNTER — Telehealth: Payer: Self-pay | Admitting: Neurology

## 2015-10-02 DIAGNOSIS — Z9989 Dependence on other enabling machines and devices: Secondary | ICD-10-CM

## 2015-10-02 DIAGNOSIS — G4733 Obstructive sleep apnea (adult) (pediatric): Secondary | ICD-10-CM

## 2015-10-02 DIAGNOSIS — G4734 Idiopathic sleep related nonobstructive alveolar hypoventilation: Secondary | ICD-10-CM

## 2015-10-02 NOTE — Telephone Encounter (Signed)
Jack Cabrera:  Patient referred by Dr. Luna KitchensJaber Khan, seen by me on 11/1/6, split study on 09/25/15, Ins: [id number]. Please call and notify patient that the recent sleep study confirmed the diagnosis of severe OSA. He did not do well enough with CPAP and BiPAP did not help fully, but was better. I would like to get patient started on autoBiPAP for now and we may have to consider a second sleep study for full night titration with BiPAP in the near future. We may have to monitor his O2 level one night after BiPAP has been established at home. I placed the orders in the chart. The patient will need a follow up appointment with me in 8 to 10 weeks post set up that has to be scheduled; please go ahead and schedule while you have the patient on the phone and make sure patient understands the importance of keeping this window for the FU appointment, as it is often an insurance requirement and failing to adhere to this may result in losing coverage for sleep apnea treatment.  Please re-enforce the importance of compliance with treatment and the need for us to monitor compliance data - again an insurance requirement and good feedback for the patient as far as how they are doing.  Also remind patient, that any upcoming CPAP machine or mask issues, should be first addressed with the DME company. Please ask if patient has a preference regarding DME company.  Please arrange for CPAP set up at home through a DME company of patient's choice - once you have spoken to the patient - and faxed/routed report to PCP and referring MD (if other than PCP), you can close this encounter, thanks,   Huston FoleySaima Aasia Peavler, MD, PhD [facility]

## 2015-10-02 NOTE — Telephone Encounter (Signed)
I spoke to patient and she is aware of results and recommendations. He is willing to proceed with treatment. I have sent orders to [facility]. I will fax report to PCP. He will call back to make appt. I will send him letter reminding him to make appt and stress importance of compliance.

## 2015-10-06 ENCOUNTER — Telehealth: Payer: Self-pay

## 2015-10-06 NOTE — Telephone Encounter (Signed)
When I last spoke to the patient he had a question about when insurance will be billed for sleep study. I confirmed with Angie. I let patient know it takes about a week after the sleep study is read.

## 2024-03-14 ENCOUNTER — Ambulatory Visit (HOSPITAL_BASED_OUTPATIENT_CLINIC_OR_DEPARTMENT_OTHER): Admitting: Student

## 2024-03-14 ENCOUNTER — Encounter (HOSPITAL_BASED_OUTPATIENT_CLINIC_OR_DEPARTMENT_OTHER): Payer: Self-pay | Admitting: Student

## 2024-03-14 VITALS — BP 154/110 | HR 95 | Temp 98.1°F | Resp 16 | Ht 74.02 in | Wt 364.3 lb

## 2024-03-14 DIAGNOSIS — G4733 Obstructive sleep apnea (adult) (pediatric): Secondary | ICD-10-CM | POA: Diagnosis not present

## 2024-03-14 DIAGNOSIS — I10 Essential (primary) hypertension: Secondary | ICD-10-CM | POA: Diagnosis not present

## 2024-03-14 DIAGNOSIS — Z13220 Encounter for screening for lipoid disorders: Secondary | ICD-10-CM | POA: Diagnosis not present

## 2024-03-14 DIAGNOSIS — F901 Attention-deficit hyperactivity disorder, predominantly hyperactive type: Secondary | ICD-10-CM | POA: Diagnosis not present

## 2024-03-14 DIAGNOSIS — Z136 Encounter for screening for cardiovascular disorders: Secondary | ICD-10-CM

## 2024-03-14 DIAGNOSIS — Z131 Encounter for screening for diabetes mellitus: Secondary | ICD-10-CM

## 2024-03-14 DIAGNOSIS — Z7689 Persons encountering health services in other specified circumstances: Secondary | ICD-10-CM

## 2024-03-14 DIAGNOSIS — Z8719 Personal history of other diseases of the digestive system: Secondary | ICD-10-CM | POA: Insufficient documentation

## 2024-03-14 DIAGNOSIS — Z720 Tobacco use: Secondary | ICD-10-CM | POA: Insufficient documentation

## 2024-03-14 MED ORDER — LOSARTAN POTASSIUM 50 MG PO TABS: 50.0000 mg | ORAL_TABLET | Freq: Every day | ORAL | 5 refills | Status: DC

## 2024-03-14 NOTE — Assessment & Plan Note (Signed)
 Pancreatitis in 2011 following self-treatment for gallstones. No current symptoms or issues related to pancreatitis.

## 2024-03-14 NOTE — Assessment & Plan Note (Signed)
 Smokes approximately one pack per week, reduced from previous higher usage. Discussed challenges of oral fixation and stress-related smoking. Explored cessation options, including Wellbutrin, nicotine patches, and lozenges. Prefers cessation without medication, opting for hard candy as a substitute for oral fixation. - Encourage use of hard candy as a substitute for smoking. - Offer follow-up visit if additional support for cessation is needed.

## 2024-03-14 NOTE — Assessment & Plan Note (Signed)
 Previously treated with Adderall, PDMP reveals last treatment in 2023. Discussed controlling BP before adding a stimulant medication. At next visit, if BP is controlled, may try Focalin XR.

## 2024-03-14 NOTE — Progress Notes (Signed)
 New Patient Office Visit  Subjective    Patient ID: Jack Cabrera, male    DOB: 09-Jul-1986  Age: 38 y.o. MRN: [id number]  CC:  Chief Complaint  Patient presents with   Establish Care    Pt. Here to establish care.    Sleep Apnea    Pt. C/o about sleep apnea. Pt. Requested a C-PAP machine.     Discussed the use of AI scribe software for clinical note transcription with the patient, who gave verbal consent to proceed.  History of Present Illness   Quintan Cabrera is a 38 year old male with sleep apnea and hypertension who presents to establish care and discuss his medical conditions.  He has significant central sleep apnea, diagnosed during a sleep study in [place]. He experiences frequent awakenings at night, often due to his children, which affects his sleep quality. He has not seen a pulmonologist for this condition but has seen a prior sleep specialist/neurologist. He was subsequently lost to follow-up.  He has a history of hypertension, previously managed with a low-dose combination pill of lisinopril and hydrochlorothiazide. He discontinued the medication after achieving low blood pressure through lifestyle changes and a homemade remedy. However, his blood pressure is currently elevated. He monitors his blood pressure at home using a cuff that is calibrated regularly. No chest pain, shortness of breath, dizziness, or palpitations.  He has a history of ADHD and was previously on Adderall, which he found effective. He discontinued it after a few months due to concerns about dependency and has not resumed it. He describes symptoms of hyperactivity, such as 'thinking quickly,' finishing others' sentences, and having a 'constant dialogue' in his head. He manages to complete tasks at work but struggles with multiple unfinished projects at home.  He smokes approximately one pack of cigarettes per week, having reduced from a pack a day five years ago. He attributes the reduction to the  birth of his child and the desire to avoid exposing the child to smoke. He describes smoking as a ritual for stress relief and is considering alternatives to address the oral fixation associated with smoking.  He has a significant family history of diabetes and high cholesterol on his mother's side, including among 'even the skinny people.' His mother had breast cancer that metastasized. He has no personal history of diabetes or high cholesterol but is aware of the family risk.  He experienced pancreatitis in 2011 after attempting a self-treatment for gallstones, which resulted in hospitalization. He is cautious about self-treating medical conditions due to this experience.  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 He uses compression socks to manage this symptom.     Screenings:  Colon Cancer: No fmh Lung Cancer: Smokes- 1 pack per week. 20 years "off and on." Was smoking 1 ppd about 5 years ago.  Diabetes: no pmh  HLD: no pmh   Outpatient Encounter Medications as of 03/14/2024  Medication Sig   losartan  (COZAAR ) 50 MG tablet Take 1 tablet (50 mg total) by mouth daily.   [DISCONTINUED] lisinopril-hydrochlorothiazide (PRINZIDE,ZESTORETIC) 10-12.5 MG tablet Take 1 tablet by mouth daily.   No facility-administered encounter medications on file as of 03/14/2024.    Past Medical History:  Diagnosis Date   Hypertension    Obesity     Past Surgical History:  Procedure Laterality Date   CHOLECYSTECTOMY      Family History  Problem Relation Age of  Onset   Hypertension Maternal Grandfather    Diabetes Paternal Grandmother     Social History   Socioeconomic History   Marital status: Married    Spouse name: Not on file   Number of children: 0   Years of education: college   Highest education level: Not on file  Occupational History   Occupation: [employer]   Tobacco Use   Smoking status: Former    Smokeless tobacco: Not on file   Tobacco comments:    Quit 08/23/2015  Substance and Sexual Activity   Alcohol use: Yes    Alcohol/week: 0.0 standard drinks of alcohol   Drug use: No   Sexual activity: Not on file  Other Topics Concern   Not on file  Social History Narrative   Not on file   Social Drivers of Health   Financial Resource Strain: Not on file  Food Insecurity: Not on file  Transportation Needs: Not on file  Physical Activity: Not on file  Stress: Not on file  Social Connections: Not on file  Intimate Partner Violence: Not on file    ROS  Per HPI      Objective    BP (!) 154/110   Pulse 95   Temp 98.1 F (36.7 C) (Oral)   Resp 16   Ht 6' 2.02" (1.88 m)   Wt (!) 364 lb 4.8 oz (165.2 kg)   SpO2 96%   BMI 46.75 kg/m   Physical Exam Constitutional:      General: He is not in acute distress.    Appearance: Normal appearance. He is not ill-appearing.  HENT:     Head: Normocephalic and atraumatic.     Right Ear: External ear normal.     Left Ear: External ear normal.     Nose: Nose normal.     Mouth/Throat:     Mouth: Mucous membranes are moist.     Pharynx: Oropharynx is clear.  Eyes:     General: No scleral icterus.    Extraocular Movements: Extraocular movements intact.     Conjunctiva/sclera: Conjunctivae normal.     Pupils: Pupils are equal, round, and reactive to light.  Neck:     Vascular: No carotid bruit.  Cardiovascular:     Rate and Rhythm: Normal rate and regular rhythm.     Pulses: Normal pulses.     Heart sounds: Normal heart sounds. No murmur heard.    No friction rub.  Pulmonary:     Effort: Pulmonary effort is normal. No respiratory distress.     Breath sounds: Normal breath sounds. No wheezing, rhonchi or rales.  Musculoskeletal:        General: Normal range of motion.     Cervical back: Neck supple.     Right lower leg: No edema.     Left lower leg: No edema.  Skin:    General: Skin is warm and dry.      Coloration: Skin is not jaundiced or pale.  Neurological:     General: No focal deficit present.     Mental Status: He is alert.  Psychiatric:        Mood and Affect: Mood normal.        Behavior: Behavior normal.         Assessment & Plan:   Encounter to establish care  OSA (obstructive sleep apnea) Assessment & Plan: Last sleep study in 2016- may need a repeat. Severe central sleep apnea. Explained differences between obstructive and central sleep  apnea. Discussed treatment options, including vagal nerve stimulators, emphasizing need for specialist care due to severity. Highlighted possibility of repeat sleep study and need for special accommodations. - Refer to pulmonologist at [facility] for further evaluation and management. - Advise on the possibility of a repeat sleep study.  Orders: -     Pulmonary Visit  Encounter for lipid screening for cardiovascular disease -     Lipid panel; Future  Screening for diabetes mellitus -     Hemoglobin A1c; Future  Primary hypertension Assessment & Plan: Hypertension managed with a homemade remedy, currently with elevated blood pressure. No chest pain, shortness of breath, dizziness, or palpitations. Previous medication, likely lisinopril with hydrochlorothiazide, discontinued due to hypotension. Losartan  selected for its renal protective benefits, especially if diabetes is present. Discussed potential need for alternative medication if diabetes is diagnosed. - Prescribe losartan  50 mg once daily. - Order fasting labs to assess for diabetes and cholesterol. - Recheck blood pressure in six weeks.  Orders: -     CBC with Differential/Platelet; Future -     Comprehensive metabolic panel with GFR; Future -     Losartan  Potassium; Take 1 tablet (50 mg total) by mouth daily.  Dispense: 30 tablet; Refill: 5  Attention deficit hyperactivity disorder (ADHD), predominantly hyperactive type Assessment & Plan: Previously treated  with Adderall, PDMP reveals last treatment in 2023. Discussed controlling BP before adding a stimulant medication. At next visit, if BP is controlled, may try Focalin XR.   Nicotine abuse Assessment & Plan: Smokes approximately one pack per week, reduced from previous higher usage. Discussed challenges of oral fixation and stress-related smoking. Explored cessation options, including Wellbutrin, nicotine patches, and lozenges. Prefers cessation without medication, opting for hard candy as a substitute for oral fixation. - Encourage use of hard candy as a substitute for smoking. - Offer follow-up visit if additional support for cessation is needed.   History of acute pancreatitis Assessment & Plan: Pancreatitis in 2011 following self-treatment for gallstones. No current symptoms or issues related to pancreatitis.    Return in about 6 weeks (around 04/25/2024) for Annual Physical/ ADHD.   Kristofor Michalowski T Erza [place]shead, PA-C

## 2024-03-14 NOTE — Patient Instructions (Addendum)
 It was nice to see you today!  As we discussed in clinic:  If you do not hear from pulmonology in 2 weeks please let me know.  Please take your BP twice daily for the next 2 weeks as you start this medication.  If you have any problems before your next visit feel free to message me via MyChart (minor issues or questions) or call the office, otherwise you may reach out to schedule an office visit.  Thank you! Neng Albee, PA-C

## 2024-03-14 NOTE — Assessment & Plan Note (Signed)
 Hypertension managed with a homemade remedy, currently with elevated blood pressure. No chest pain, shortness of breath, dizziness, or palpitations. Previous medication, likely lisinopril with hydrochlorothiazide, discontinued due to hypotension. Losartan  selected for its renal protective benefits, especially if diabetes is present. Discussed potential need for alternative medication if diabetes is diagnosed. - Prescribe losartan  50 mg once daily. - Order fasting labs to assess for diabetes and cholesterol. - Recheck blood pressure in six weeks.

## 2024-03-14 NOTE — Assessment & Plan Note (Signed)
 Last sleep study in 2016- may need a repeat. Severe central sleep apnea. Explained differences between obstructive and central sleep apnea. Discussed treatment options, including vagal nerve stimulators, emphasizing need for specialist care due to severity. Highlighted possibility of repeat sleep study and need for special accommodations. - Refer to pulmonologist at [facility] for further evaluation and management. - Advise on the possibility of a repeat sleep study.

## 2024-04-25 ENCOUNTER — Encounter (HOSPITAL_BASED_OUTPATIENT_CLINIC_OR_DEPARTMENT_OTHER): Admitting: Student

## 2024-04-25 NOTE — Progress Notes (Deleted)
   Complete physical exam  Patient: Jack Cabrera   DOB: August 04, 1986   38 y.o. Male  MRN: [id number]  Subjective:     No chief complaint on file.   Jack Cabrera is a 38 y.o. male who presents today for a complete physical exam. He reports consuming a {diet types:17450} diet. {types:19826} He generally feels {DESC; WELL/FAIRLY WELL/POORLY:18703}. He reports sleeping {DESC; WELL/FAIRLY WELL/POORLY:18703}. He does have additional problems to discuss today.   ADHD- Patient reports that he has had no difficulties with sleep from the medication. Will place on meds if hypertension is controlled  He has a history of ADHD and was previously on Adderall, which he found effective. He discontinued it after a few months due to concerns about dependency and has not resumed it. He describes symptoms of hyperactivity, such as 'thinking quickly,' finishing others' sentences, and having a 'constant dialogue' in his head. He manages to complete tasks at work but struggles with multiple unfinished projects at home.  He smokes approximately one pack of cigarettes per week, having reduced from a pack a day five years ago. He attributes the reduction to the birth of his child and the desire to avoid exposing the child to smoke. He describes smoking as a ritual for stress relief and is considering alternatives to address the oral fixation associated with smoking.  NEED LABS TODAY   Most recent fall risk assessment:    03/14/2024    8:17 AM  Fall Risk   Falls in the past year? 0  Number falls in past yr: 0  Injury with Fall? 0  Risk for fall due to : No Fall Risks  Follow up Falls evaluation completed     Most recent depression screenings:    03/14/2024    8:17 AM  PHQ 2/9 Scores  PHQ - 2 Score 1  PHQ- 9 Score 5    {VISON DENTAL STD PSA (Optional):27386}  {History (Optional):23778}  Patient Care Team: Beecher Bower, MD as Referring Physician (Family Medicine)   Outpatient Medications Prior to  Visit  Medication Sig   losartan  (COZAAR ) 50 MG tablet Take 1 tablet (50 mg total) by mouth daily.   No facility-administered medications prior to visit.    ROS        Objective:     There were no vitals taken for this visit. {Vitals History (Optional):23777}  Physical Exam   No results found for any visits on 04/25/24. {Show previous labs (optional):23779}    Assessment & Plan:    Routine Health Maintenance and Physical Exam  Health Maintenance  Topic Date Due   HIV Screening  Never done   Hepatitis C Screening  Never done   COVID-19 Vaccine (1 - 2024-25 season) 03/14/2025*   Flu Shot  06/22/2024   DTaP/Tdap/Td vaccine (2 - Td or Tdap) 09/27/2028   HPV Vaccine  Aged Out   Meningitis B Vaccine  Aged Out  *Topic was postponed. The date shown is not the original due date.    Discussed health benefits of physical activity, and encouraged him to engage in regular exercise appropriate for his age and condition.  There are no diagnoses linked to this encounter.  No follow-ups on file.     Holle Sprick T Krishang [place], PA-C

## 2024-04-26 ENCOUNTER — Ambulatory Visit (INDEPENDENT_AMBULATORY_CARE_PROVIDER_SITE_OTHER): Admitting: Student

## 2024-04-26 ENCOUNTER — Encounter (HOSPITAL_BASED_OUTPATIENT_CLINIC_OR_DEPARTMENT_OTHER): Payer: Self-pay | Admitting: Student

## 2024-04-26 VITALS — BP 145/94 | HR 89 | Temp 98.0°F | Resp 16 | Ht 74.0 in | Wt 373.2 lb

## 2024-04-26 DIAGNOSIS — Z0000 Encounter for general adult medical examination without abnormal findings: Secondary | ICD-10-CM | POA: Insufficient documentation

## 2024-04-26 DIAGNOSIS — F901 Attention-deficit hyperactivity disorder, predominantly hyperactive type: Secondary | ICD-10-CM

## 2024-04-26 DIAGNOSIS — Z13220 Encounter for screening for lipoid disorders: Secondary | ICD-10-CM

## 2024-04-26 DIAGNOSIS — I10 Essential (primary) hypertension: Secondary | ICD-10-CM | POA: Diagnosis not present

## 2024-04-26 DIAGNOSIS — Z6841 Body Mass Index (BMI) 40.0 and over, adult: Secondary | ICD-10-CM | POA: Insufficient documentation

## 2024-04-26 DIAGNOSIS — Z131 Encounter for screening for diabetes mellitus: Secondary | ICD-10-CM

## 2024-04-26 DIAGNOSIS — Z136 Encounter for screening for cardiovascular disorders: Secondary | ICD-10-CM

## 2024-04-26 MED ORDER — DEXMETHYLPHENIDATE HCL ER 10 MG PO CP24: 10.0000 mg | ORAL_CAPSULE | Freq: Every day | ORAL | 0 refills | Status: DC

## 2024-04-26 NOTE — Patient Instructions (Signed)
 It was nice to see you today!  Please check your blood pressure daily while you are on the stimulant medication for ADHD.   If you have any problems before your next visit feel free to message me via MyChart (minor issues or questions) or call the office, otherwise you may reach out to schedule an office visit.  Thank you! Avangelina Flight, PA-C  Health Maintenance, Male Adopting a healthy lifestyle and getting preventive care are important in promoting health and wellness. Ask your health care provider about: The right schedule for you to have regular tests and exams. Things you can do on your own to prevent diseases and keep yourself healthy. What should I know about diet, weight, and exercise? Eat a healthy diet  Eat a diet that includes plenty of vegetables, fruits, low-fat dairy products, and lean protein. Do not eat a lot of foods that are high in solid fats, added sugars, or sodium. Maintain a healthy weight Body mass index (BMI) is a measurement that can be used to identify possible weight problems. It estimates body fat based on height and weight. Your health care provider can help determine your BMI and help you achieve or maintain a healthy weight. Get regular exercise Get regular exercise. This is one of the most important things you can do for your health. Most adults should: Exercise for at least 150 minutes each week. The exercise should increase your heart rate and make you sweat (moderate-intensity exercise). Do strengthening exercises at least twice a week. This is in addition to the moderate-intensity exercise. Spend less time sitting. Even light physical activity can be beneficial. Watch cholesterol and blood lipids Have your blood tested for lipids and cholesterol at 40 years of age, then have this test every 5 years. You may need to have your cholesterol levels checked more often if: Your lipid or cholesterol levels are high. You are older than 40 years of age. You  are at high risk for heart disease. What should I know about cancer screening? Many types of cancers can be detected early and may often be prevented. Depending on your health history and family history, you may need to have cancer screening at various ages. This may include screening for: Colorectal cancer. Prostate cancer. Skin cancer. Lung cancer. What should I know about heart disease, diabetes, and high blood pressure? Blood pressure and heart disease High blood pressure causes heart disease and increases the risk of stroke. This is more likely to develop in people who have high blood pressure readings or are overweight. Talk with your health care provider about your target blood pressure readings. Have your blood pressure checked: Every 3-5 years if you are 51-67 years of age. Every year if you are 1 years old or older. If you are between the ages of 38 and 57 and are a current or former smoker, ask your health care provider if you should have a one-time screening for abdominal aortic aneurysm (AAA). Diabetes Have regular diabetes screenings. This checks your fasting blood sugar level. Have the screening done: Once every three years after age 15 if you are at a normal weight and have a low risk for diabetes. More often and at a younger age if you are overweight or have a high risk for diabetes. What should I know about preventing infection? Hepatitis B If you have a higher risk for hepatitis B, you should be screened for this virus. Talk with your health care provider to find out if you are  at risk for hepatitis B infection. Hepatitis C Blood testing is recommended for: Everyone born from [birth year] through 1967. Anyone with known risk factors for hepatitis C. Sexually transmitted infections (STIs) You should be screened each year for STIs, including gonorrhea and chlamydia, if: You are sexually active and are younger than 40 years of age. You are older than 40 years of age and your  health care provider tells you that you are at risk for this type of infection. Your sexual activity has changed since you were last screened, and you are at increased risk for chlamydia or gonorrhea. Ask your health care provider if you are at risk. Ask your health care provider about whether you are at high risk for HIV. Your health care provider may recommend a prescription medicine to help prevent HIV infection. If you choose to take medicine to prevent HIV, you should first get tested for HIV. You should then be tested every 3 months for as long as you are taking the medicine. Follow these instructions at home: Alcohol use Do not drink alcohol if your health care provider tells you not to drink. If you drink alcohol: Limit how much you have to 0-2 drinks a day. Know how much alcohol is in your drink. In the [place] one drink equals one 12 oz bottle of beer (355 mL), one 5 oz glass of wine (148 mL), or one 1 oz glass of hard liquor (44 mL). Lifestyle Do not use any products that contain nicotine or tobacco. These products include cigarettes, chewing tobacco, and vaping devices, such as e-cigarettes. If you need help quitting, ask your health care provider. Do not use street drugs. Do not share needles. Ask your health care provider for help if you need support or information about quitting drugs. General instructions Schedule regular health, dental, and eye exams. Stay current with your vaccines. Tell your health care provider if: You often feel depressed. You have ever been abused or do not feel safe at home. Summary Adopting a healthy lifestyle and getting preventive care are important in promoting health and wellness. Follow your health care provider's instructions about healthy diet, exercising, and getting tested or screened for diseases. Follow your health care provider's instructions on monitoring your cholesterol and blood pressure. This information is not intended to replace  advice given to you by your health care provider. Make sure you discuss any questions you have with your health care provider. Document Revised: 03/30/2021 Document Reviewed: 03/30/2021 Elsevier Patient Education  2024 Elsevier Inc.    Why follow it? Research shows. Those who follow the Mediterranean diet have a reduced risk of heart disease  The diet is associated with a reduced incidence of Parkinson's and Alzheimer's diseases People following the diet may have longer life expectancies and lower rates of chronic diseases  The Dietary Guidelines for Americans recommends the Mediterranean diet as an eating plan to promote health and prevent disease  What Is the Mediterranean Diet?  Healthy eating plan based on typical foods and recipes of Mediterranean-style cooking The diet is primarily a plant based diet; these foods should make up a majority of meals   Starches - Plant based foods should make up a majority of meals - They are an important sources of vitamins, minerals, energy, antioxidants, and fiber - Choose whole grains, foods high in fiber and minimally processed items  - Typical grain sources include wheat, oats, barley, corn, brown rice, bulgar, farro, millet, polenta, couscous  - Various types of beans include  chickpeas, lentils, fava beans, black beans, white beans   Fruits  Veggies - Large quantities of antioxidant rich fruits & veggies; 6 or more servings  - Vegetables can be eaten raw or lightly drizzled with oil and cooked  - Vegetables common to the traditional Mediterranean Diet include: artichokes, arugula, beets, broccoli, brussel sprouts, cabbage, carrots, celery, collard greens, cucumbers, eggplant, kale, leeks, lemons, lettuce, mushrooms, okra, onions, peas, peppers, potatoes, pumpkin, radishes, rutabaga, shallots, spinach, sweet potatoes, turnips, zucchini - Fruits common to the Mediterranean Diet include: apples, apricots, avocados, cherries, clementines, dates, figs,  grapefruits, grapes, melons, nectarines, oranges, peaches, pears, pomegranates, strawberries, tangerines  Fats - Replace butter and margarine with healthy oils, such as olive oil, canola oil, and tahini  - Limit nuts to no more than a handful a day  - Nuts include walnuts, almonds, pecans, pistachios, pine nuts  - Limit or avoid candied, honey roasted or heavily salted nuts - Olives are central to the [employer] - can be eaten whole or used in a variety of dishes   Meats Protein - Limiting red meat: no more than a few times a month - When eating red meat: choose lean cuts and keep the portion to the size of deck of cards - Eggs: approx. 0 to 4 times a week  - Fish and lean poultry: at least 2 a week  - Healthy protein sources include, chicken, [place], lean beef, lamb - Increase intake of seafood such as tuna, salmon, trout, mackerel, shrimp, scallops - Avoid or limit high fat processed meats such as sausage and bacon  Dairy - Include moderate amounts of low fat dairy products  - Focus on healthy dairy such as fat free yogurt, skim milk, low or reduced fat cheese - Limit dairy products higher in fat such as whole or 2% milk, cheese, ice cream  Alcohol - Moderate amounts of red wine is ok  - No more than 5 oz daily for women (all ages) and men older than age 89  - No more than 10 oz of wine daily for men younger than 2  Other - Limit sweets and other desserts  - Use herbs and spices instead of salt to flavor foods  - Herbs and spices common to the traditional Mediterranean Diet include: basil, bay leaves, chives, cloves, cumin, fennel, garlic, lavender, marjoram, mint, oregano, parsley, pepper, rosemary, sage, savory, sumac, tarragon, thyme   It's not just a diet, it's a lifestyle:  The Mediterranean diet includes lifestyle factors typical of those in the region  Foods, drinks and meals are best eaten with others and savored Daily physical activity is important for overall good  health This could be strenuous exercise like running and aerobics This could also be more leisurely activities such as walking, housework, yard-work, or taking the stairs Moderation is the key; a balanced and healthy diet accommodates most foods and drinks Consider portion sizes and frequency of consumption of certain foods   Meal Ideas & Options:  Breakfast:  Whole wheat toast or whole wheat English muffins with peanut butter & hard boiled egg Steel cut oats topped with apples & cinnamon and skim milk  Fresh fruit: banana, strawberries, melon, berries, peaches  Smoothies: strawberries, bananas, greek yogurt, peanut butter Low fat greek yogurt with blueberries and granola  Egg white omelet with spinach and mushrooms Breakfast couscous: whole wheat couscous, apricots, skim milk, cranberries  Sandwiches:  Hummus and grilled vegetables (peppers, zucchini, squash) on whole wheat bread   Grilled chicken  on whole wheat pita with lettuce, tomatoes, cucumbers or tzatziki  [place] salad on whole wheat bread: tuna salad made with greek yogurt, olives, red peppers, capers, green onions Garlic rosemary lamb pita: lamb sauted with garlic, rosemary, salt & pepper; add lettuce, cucumber, greek yogurt to pita - flavor with lemon juice and black pepper  Seafood:  Mediterranean grilled salmon, seasoned with garlic, basil, parsley, lemon juice and black pepper Shrimp, lemon, and spinach whole-grain pasta salad made with low fat greek yogurt  Seared scallops with lemon orzo  Seared tuna steaks seasoned salt, pepper, coriander topped with tomato mixture of olives, tomatoes, olive oil, minced garlic, parsley, green onions and cappers  Meats:  Herbed greek chicken salad with kalamata olives, cucumber, feta  Red bell peppers stuffed with spinach, bulgur, lean ground beef (or lentils) & topped with feta   Kebabs: skewers of chicken, tomatoes, onions, zucchini, squash  [place] burgers: made with red onions, mint,  dill, lemon juice, feta cheese topped with roasted red peppers Vegetarian Cucumber salad: cucumbers, artichoke hearts, celery, red onion, feta cheese, tossed in olive oil & lemon juice  Hummus and whole grain pita points with a greek salad (lettuce, tomato, feta, olives, cucumbers, red onion) Lentil soup with celery, carrots made with vegetable broth, garlic, salt and pepper  Tabouli salad: parsley, bulgur, mint, scallions, cucumbers, tomato, radishes, lemon juice, olive oil, salt and pepper.      American Heart Association (AHA) Exercise Recommendation  Being physically active is important to prevent heart disease and stroke, the nation's No. 1and No. 5killers. To improve overall cardiovascular health, we suggest at least 150 minutes per week of moderate exercise or 75 minutes per week of vigorous exercise (or a combination of moderate and vigorous activity). Thirty minutes a day, five times a week is an easy goal to remember. You will also experience benefits even if you divide your time into two or three segments of 10 to 15 minutes per day.  For people who would benefit from lowering their blood pressure or cholesterol, we recommend 40 minutes of aerobic exercise of moderate to vigorous intensity three to four times a week to lower the risk for heart attack and stroke.  Physical activity is anything that makes you move your body and burn calories.  This includes things like climbing stairs or playing sports. Aerobic exercises benefit your heart, and include walking, jogging, swimming or biking. Strength and stretching exercises are best for overall stamina and flexibility.  The simplest, positive change you can make to effectively improve your heart health is to start walking. It's enjoyable, free, easy, social and great exercise. A walking program is flexible and boasts high success rates because people can stick with it. It's easy for walking to become a regular and satisfying part of  life.   For Overall Cardiovascular Health: At least 30 minutes of moderate-intensity aerobic activity at least 5 days per week for a total of 150  OR  At least 25 minutes of vigorous aerobic activity at least 3 days per week for a total of 75 minutes; or a combination of moderate- and vigorous-intensity aerobic activity  AND  Moderate- to high-intensity muscle-strengthening activity at least 2 days per week for additional health benefits.  For Lowering Blood Pressure and Cholesterol An average 40 minutes of moderate- to vigorous-intensity aerobic activity 3 or 4 times per week  What if I can't make it to the time goal? Something is always better than nothing! And everyone has to start  somewhere. Even if you've been sedentary for years, today is the day you can begin to make healthy changes in your life. If you don't think you'll make it for 30 or 40 minutes, set a reachable goal for today. You can work up toward your overall goal by increasing your time as you get stronger. Don't let all-or-nothing thinking rob you of doing what you can every day.  Source:http://www.heart.org

## 2024-04-26 NOTE — Progress Notes (Signed)
 Complete physical exam  Patient: Jack Cabrera   DOB: 07/13/1986   37 y.o. Male  MRN: [id number]  Subjective:     Chief Complaint  Patient presents with   Annual Exam    Here for physical. BP dropped to 110's/60's after trying medicine. Had to take 1/2 tab for a few days and then 1 tablet. Has not checked BP lately.     Christpher Stogsdill is a 38 y.o. male who presents today for a complete physical exam. He reports consuming a general diet. Patient notes that he is swimming 5 days per week for around 2 hours, he notes that he is also gardening. He generally feels well. He reports sleeping poorly. States that he wakes up 2-6 times per night between peeing or dealing with children. He does have additional problems to discuss today.   ADHD- Patient reports that he has had no difficulties with sleep from the medication last time but he is concerned about this with future medication. Discussed SE to look out for. Hypertension is better controlled today, will initiate with careful instructions to watch BP.  Interval history from last visit (03/14/24): He has a history of ADHD and was previously on Adderall, which he found effective. He discontinued it after a few months due to concerns about dependency and has not resumed it. He describes symptoms of hyperactivity, such as 'thinking quickly,' finishing others' sentences, and having a 'constant dialogue' in his head. He manages to complete tasks at work but struggles with multiple unfinished projects at home.  Most recent fall risk assessment:    03/14/2024    8:17 AM  Fall Risk   Falls in the past year? 0  Number falls in past yr: 0  Injury with Fall? 0  Risk for fall due to : No Fall Risks  Follow up Falls evaluation completed     Most recent depression screenings:    03/14/2024    8:17 AM  PHQ 2/9 Scores  PHQ - 2 Score 1  PHQ- 9 Score 5    Vision:Within last year and Dental: No current dental problems and Receives regular dental  care  Patient Active Problem List   Diagnosis Date Noted   Adult general medical exam 04/26/2024   BMI 45.0-49.9, adult (HCC) 04/26/2024   Morbid obesity (HCC) 04/26/2024   OSA (obstructive sleep apnea) 03/14/2024   Nicotine abuse 03/14/2024   Primary hypertension 03/14/2024   Attention deficit hyperactivity disorder (ADHD), predominantly hyperactive type 03/14/2024   History of acute pancreatitis 03/14/2024   Social History   Tobacco Use   Smoking status: Some Days    Current packs/day: 0.25    Average packs/day: 0.3 packs/day for 23.4 years (5.9 ttl pk-yrs)    Types: Cigarettes    Start date: 2002    Passive exposure: Current   Tobacco comments:    1 pack per month   Vaping Use   Vaping status: Never Used  Substance Use Topics   Alcohol use: Yes    Comment: brandy   Drug use: No   No Known Allergies    Patient Care Team: Chou Busler T, PA-C as PCP - General (Physician Assistant) Beecher Bower, MD as Referring Physician (Family Medicine)   Outpatient Medications Prior to Visit  Medication Sig   losartan  (COZAAR ) 50 MG tablet Take 1 tablet (50 mg total) by mouth daily.   No facility-administered medications prior to visit.    ROS  Per HPI     Objective:  BP (!) 145/94   Pulse 89   Temp 98 F (36.7 C) (Oral)   Resp 16   Ht 6\' 2"  (1.88 m)   Wt (!) 373 lb 3.2 oz (169.3 kg)   SpO2 96%   BMI 47.92 kg/m  BP Readings from Last 3 Encounters:  04/26/24 (!) 145/94  03/14/24 (!) 154/110  09/23/15 (!) 138/94   Wt Readings from Last 3 Encounters:  04/26/24 (!) 373 lb 3.2 oz (169.3 kg)  03/14/24 (!) 364 lb 4.8 oz (165.2 kg)  09/23/15 (!) 340 lb (154.2 kg)      Physical Exam Constitutional:      General: He is not in acute distress.    Appearance: Normal appearance. He is not ill-appearing or diaphoretic.  HENT:     Head: Normocephalic and atraumatic.     Right Ear: Tympanic membrane, ear canal and external ear normal.     Left Ear: Tympanic  membrane, ear canal and external ear normal.     Nose: Nose normal.     Mouth/Throat:     Mouth: Mucous membranes are moist.     Pharynx: Oropharynx is clear.  Eyes:     General: No scleral icterus.       Right eye: No discharge.        Left eye: No discharge.     Extraocular Movements: Extraocular movements intact.     Conjunctiva/sclera: Conjunctivae normal.     Pupils: Pupils are equal, round, and reactive to light.  Neck:     Thyroid: No thyroid mass, thyromegaly or thyroid tenderness.     Vascular: No carotid bruit.  Cardiovascular:     Rate and Rhythm: Normal rate and regular rhythm.     Pulses: Normal pulses.     Heart sounds: Normal heart sounds. No murmur heard.    No friction rub. No gallop.  Pulmonary:     Effort: Pulmonary effort is normal. No respiratory distress.     Breath sounds: Normal breath sounds. No wheezing, rhonchi or rales.  Chest:     Chest wall: No tenderness.  Abdominal:     General: Bowel sounds are normal. There is no distension.     Palpations: Abdomen is soft.     Tenderness: There is no abdominal tenderness. There is no guarding.  Musculoskeletal:        General: No swelling, deformity or signs of injury. Normal range of motion.     Cervical back: Neck supple.     Right lower leg: No edema.     Left lower leg: No edema.  Lymphadenopathy:     Cervical: No cervical adenopathy.     Right cervical: No superficial or posterior cervical adenopathy.    Left cervical: No superficial cervical adenopathy.  Skin:    General: Skin is warm and dry.     Coloration: Skin is not jaundiced or pale.     Findings: No rash.  Neurological:     General: No focal deficit present.     Mental Status: He is alert and oriented to person, place, and time.     Motor: No weakness.  Psychiatric:        Mood and Affect: Mood normal.        Behavior: Behavior normal.      No results found for any visits on 04/26/24.      Assessment & Plan:    Routine Health  Maintenance and Physical Exam  Health Maintenance  Topic Date Due  HIV Screening  Never done   Hepatitis C Screening  Never done   Pneumococcal Vaccination (1 of 2 - PCV) Never done   COVID-19 Vaccine (1 - 2024-25 season) 03/14/2025*   Flu Shot  06/22/2024   DTaP/Tdap/Td vaccine (2 - Td or Tdap) 09/27/2028   HPV Vaccine  Aged Out   Meningitis B Vaccine  Aged Out  *Topic was postponed. The date shown is not the original due date.    Discussed health benefits of physical activity, and encouraged him to engage in regular exercise appropriate for his age and condition.  Adult general medical exam Assessment & Plan: He is due for baseline labs to assess overall health status, including cholesterol, A1c, CBC, and CMP. He is compliant with regular eye and dental check-ups. - Order baseline labs including cholesterol, A1c, CBC, and CMP - Perform urine drug screen at start of stimulant medication. - Encourage regular eye and dental check-ups   BMI 45.0-49.9, adult (HCC) Assessment & Plan: BMI is 47.92, indicating morbid obesity. He gained 25-30 pounds after discontinuing the keto diet. Discussed dietary options including keto, intermittent fasting, and calorie watching, emphasizing sustainability and the goal of reducing BMI below 40. - Encourage dietary modifications such as keto, intermittent fasting, or calorie watching - Monitor weight and aim to reduce BMI below 40   Attention deficit hyperactivity disorder (ADHD), predominantly hyperactive type Assessment & Plan: Chronic, not at goal. He has ADHD and is interested in medication management. Discussed starting Focalin XR. Side effects include palpitations, hypertension, and insomnia, necessitating close monitoring of blood pressure. - Prescribe Focalin XR 10 mg daily - Monitor for side effects such as palpitations, hypertension, and insomnia - Schedule follow-up in four weeks to assess medication efficacy and adjust dosage if  necessary - Monitor blood pressure closely while on medication  Orders: -     Dexmethylphenidate HCl ER; Take 1 capsule (10 mg total) by mouth daily.  Dispense: 30 capsule; Refill: 0 -     ToxASSURE Select 13 (MW), Urine  Screening for diabetes mellitus -     Hemoglobin A1c  Encounter for lipid screening for cardiovascular disease -     Lipid panel  Primary hypertension Assessment & Plan: Chronic, stable at home. Possible white coat hypertension. Hypertension is well-controlled with medication, with recent readings around 120-130 mmHg systolic. Initial dizziness resolved after dose adjustment. Home readings are satisfactory. Discussed medication timing, preferring morning dosing due to natural cortisol and blood pressure rise upon waking. - Advise taking blood pressure medication in the morning with food to avoid gastrointestinal issues - Monitor blood pressure twice daily, once in the morning and once at night  Orders: -     Comprehensive metabolic panel with GFR -     CBC with Differential/Platelet  Morbid obesity (HCC) Assessment & Plan: BMI is 47.92, indicating morbid obesity. He gained 25-30 pounds after discontinuing the keto diet. Discussed dietary options including keto, intermittent fasting, and calorie watching, emphasizing sustainability and the goal of reducing BMI below 40. - Encourage dietary modifications such as keto, intermittent fasting, or calorie watching - Monitor weight and aim to reduce BMI below 40     Return in about 4 weeks (around 05/24/2024) for adhd.     Marlis Oldaker T Ayden Apodaca, PA-C

## 2024-04-26 NOTE — Assessment & Plan Note (Signed)
 Chronic, stable at home. Possible white coat hypertension. Hypertension is well-controlled with medication, with recent readings around 120-130 mmHg systolic. Initial dizziness resolved after dose adjustment. Home readings are satisfactory. Discussed medication timing, preferring morning dosing due to natural cortisol and blood pressure rise upon waking. - Advise taking blood pressure medication in the morning with food to avoid gastrointestinal issues - Monitor blood pressure twice daily, once in the morning and once at night

## 2024-04-26 NOTE — Assessment & Plan Note (Signed)
 Chronic, not at goal. He has ADHD and is interested in medication management. Discussed starting Focalin XR. Side effects include palpitations, hypertension, and insomnia, necessitating close monitoring of blood pressure. - Prescribe Focalin XR 10 mg daily - Monitor for side effects such as palpitations, hypertension, and insomnia - Schedule follow-up in four weeks to assess medication efficacy and adjust dosage if necessary - Monitor blood pressure closely while on medication

## 2024-04-26 NOTE — Assessment & Plan Note (Signed)
 BMI is 47.92, indicating morbid obesity. He gained 25-30 pounds after discontinuing the keto diet. Discussed dietary options including keto, intermittent fasting, and calorie watching, emphasizing sustainability and the goal of reducing BMI below 40. - Encourage dietary modifications such as keto, intermittent fasting, or calorie watching - Monitor weight and aim to reduce BMI below 40

## 2024-04-26 NOTE — Assessment & Plan Note (Signed)
 He is due for baseline labs to assess overall health status, including cholesterol, A1c, CBC, and CMP. He is compliant with regular eye and dental check-ups. - Order baseline labs including cholesterol, A1c, CBC, and CMP - Perform urine drug screen at start of stimulant medication. - Encourage regular eye and dental check-ups

## 2024-04-27 LAB — CBC WITH DIFFERENTIAL/PLATELET
Basophils Absolute: 0.1 10*3/uL (ref 0.0–0.2)
Basos: 1 %
EOS (ABSOLUTE): 0.2 10*3/uL (ref 0.0–0.4)
Eos: 3 %
Hematocrit: 49.4 % (ref 37.5–51.0)
Hemoglobin: 16.5 g/dL (ref 13.0–17.7)
Immature Grans (Abs): 0 10*3/uL (ref 0.0–0.1)
Immature Granulocytes: 0 %
Lymphocytes Absolute: 2.6 10*3/uL (ref 0.7–3.1)
Lymphs: 39 %
MCH: 28.9 pg (ref 26.6–33.0)
MCHC: 33.4 g/dL (ref 31.5–35.7)
MCV: 87 fL (ref 79–97)
Monocytes Absolute: 0.4 10*3/uL (ref 0.1–0.9)
Monocytes: 6 %
Neutrophils Absolute: 3.4 10*3/uL (ref 1.4–7.0)
Neutrophils: 51 %
Platelets: 208 10*3/uL (ref 150–450)
RBC: 5.71 x10E6/uL (ref 4.14–5.80)
RDW: 14.8 % (ref 11.6–15.4)
WBC: 6.7 10*3/uL (ref 3.4–10.8)

## 2024-04-27 LAB — LIPID PANEL
Chol/HDL Ratio: 5.2 ratio — ABNORMAL HIGH (ref 0.0–5.0)
Cholesterol, Total: 162 mg/dL (ref 100–199)
HDL: 31 mg/dL — ABNORMAL LOW (ref >39)
LDL Chol Calc (NIH): 104 mg/dL — ABNORMAL HIGH (ref 0–99)
Triglycerides: 152 mg/dL — ABNORMAL HIGH (ref 0–149)
VLDL Cholesterol Cal: 27 mg/dL (ref 5–40)

## 2024-04-27 LAB — HEMOGLOBIN A1C
Est. average glucose Bld gHb Est-mCnc: 148 mg/dL
Hgb A1c MFr Bld: 6.8 % — ABNORMAL HIGH (ref 4.8–5.6)

## 2024-04-27 LAB — COMPREHENSIVE METABOLIC PANEL WITH GFR
ALT: 50 IU/L — ABNORMAL HIGH (ref 0–44)
AST: 37 IU/L (ref 0–40)
Albumin: 4.4 g/dL (ref 4.1–5.1)
Alkaline Phosphatase: 93 IU/L (ref 44–121)
BUN/Creatinine Ratio: 17 (ref 9–20)
BUN: 11 mg/dL (ref 6–20)
Bilirubin Total: 0.5 mg/dL (ref 0.0–1.2)
CO2: 22 mmol/L (ref 20–29)
Calcium: 9 mg/dL (ref 8.7–10.2)
Chloride: 104 mmol/L (ref 96–106)
Creatinine, Ser: 0.65 mg/dL — ABNORMAL LOW (ref 0.76–1.27)
Globulin, Total: 2.3 g/dL (ref 1.5–4.5)
Glucose: 112 mg/dL — ABNORMAL HIGH (ref 70–99)
Potassium: 4.1 mmol/L (ref 3.5–5.2)
Sodium: 140 mmol/L (ref 134–144)
Total Protein: 6.7 g/dL (ref 6.0–8.5)
eGFR: 123 mL/min/{1.73_m2} (ref >59)

## 2024-04-30 ENCOUNTER — Ambulatory Visit (HOSPITAL_BASED_OUTPATIENT_CLINIC_OR_DEPARTMENT_OTHER): Payer: Self-pay | Admitting: Student

## 2024-05-01 LAB — TOXASSURE SELECT 13 (MW), URINE

## 2024-05-02 ENCOUNTER — Encounter (HOSPITAL_BASED_OUTPATIENT_CLINIC_OR_DEPARTMENT_OTHER): Payer: Self-pay

## 2024-05-09 ENCOUNTER — Ambulatory Visit (INDEPENDENT_AMBULATORY_CARE_PROVIDER_SITE_OTHER): Admitting: Nurse Practitioner

## 2024-05-09 ENCOUNTER — Encounter: Payer: Self-pay | Admitting: Nurse Practitioner

## 2024-05-09 VITALS — BP 145/103 | HR 117 | Ht 75.0 in | Wt 373.4 lb

## 2024-05-09 DIAGNOSIS — G4733 Obstructive sleep apnea (adult) (pediatric): Secondary | ICD-10-CM

## 2024-05-09 DIAGNOSIS — G4719 Other hypersomnia: Secondary | ICD-10-CM

## 2024-05-09 DIAGNOSIS — Z6841 Body Mass Index (BMI) 40.0 and over, adult: Secondary | ICD-10-CM

## 2024-05-09 DIAGNOSIS — J351 Hypertrophy of tonsils: Secondary | ICD-10-CM | POA: Diagnosis not present

## 2024-05-09 DIAGNOSIS — F17210 Nicotine dependence, cigarettes, uncomplicated: Secondary | ICD-10-CM

## 2024-05-09 NOTE — Patient Instructions (Signed)
 Given your symptoms and history, I am concerned that you still have sleep disordered breathing with sleep apnea. You will need a sleep study for further evaluation. Someone will contact you to schedule this.   We discussed how untreated sleep apnea puts an individual at risk for cardiac arrhthymias, pulm HTN, DM, stroke and increases their risk for daytime accidents. We also briefly reviewed treatment options including weight loss, side sleeping position, oral appliance, CPAP therapy or referral to ENT for possible surgical options  Use caution when driving and pull over if you become sleepy.  Follow up in 6 weeks with Jack Calistro Rauf,NP to go over sleep study results, or sooner, if needed. Friday PM virtual clinic preferred

## 2024-05-09 NOTE — Progress Notes (Signed)
 @Patient  ID: Jack Cabrera, male    DOB: [birth year], 38 y.o.   MRN: [id number]  Chief Complaint  Patient presents with   Consult    sleep    Referring provider: Rothfuss, Jacob T, PA-C  HPI: 38 year old male, some day smoker referred for sleep consult. Past medical history significant for sleep apnea previously on BiPAP, HTN, obesity, hx of pancreatitis, ADHD.   TEST/EVENTS:  09/25/2015 split night sleep study: AHI 99.2 >> inadequate control on CPAP with emergent central apneas. Improved on BiPAP but full titration not completed  05/09/2024: Today - sleep consult Discussed the use of AI scribe software for clinical note transcription with the patient, who gave verbal consent to proceed.  History of Present Illness   Jack Cabrera is a 38 year old male with significant sleep apnea who presents for reassessment of his condition.  He has a history of significant sleep apnea and was previously on BiPAP therapy, which he discontinued approximately six years ago due to nasal issues and frequent travel. Since discontinuing BiPAP, he has gained about weight, with his weight being 340 pounds during his initial sleep studies.  He experiences daytime fatigue, which he attributes to having three young children. No drowsy driving or falling asleep while driving. He does not take any sleep medications. He experiences frequent awakenings at night, either due to himself or his children waking him up, but he falls asleep relatively easily. He consumes a significant amount of caffeine daily, including coffee, mushroom coffee, and espresso, estimating his intake to be about a pot a day.  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 He notes that he cannot fall asleep unless he is ready to sleep.  He does snore loudly. Has been told he stops breathing. No drowsy driving or sleep parasomnias/paralysis. He felt  better when he was on BiPAP in the past. He would be open to resuming therapy.   He goes to bed between 9pm and midnight. Falls asleep within 20-30 min. Gets up multiple times a night. Wakes around 6-630 am. Last sleep study in 2016.    Epworth 4  No Known Allergies  Immunization History  Administered Date(s) Administered   Tdap 09/27/2018    Past Medical History:  Diagnosis Date   Hypertension    Obesity     Tobacco History: Social History   Tobacco Use  Smoking Status Some Days   Current packs/day: 0.25   Average packs/day: 0.3 packs/day for 23.5 years (5.9 ttl pk-yrs)   Types: Cigarettes   Start date: 2002   Passive exposure: Current  Smokeless Tobacco Not on file  Tobacco Comments   1 pack per month 05/09/2024 KRD   Ready to quit: Not Answered Counseling given: Not Answered Tobacco comments: 1 pack per month 05/09/2024 KRD   Outpatient Medications Prior to Visit  Medication Sig Dispense Refill   dexmethylphenidate  (FOCALIN  XR) 10 MG 24 hr capsule Take 1 capsule (10 mg total) by mouth daily. 30 capsule 0   losartan  (COZAAR ) 50 MG tablet Take 1 tablet (50 mg total) by mouth daily. 30 tablet 5   No facility-administered medications prior to visit.     Review of Systems:   Constitutional: No night sweats, fevers, chills,  or lassitude. +weight gain, fatigue  HEENT: No headaches, difficulty swallowing, tooth/dental problems, or sore throat. No sneezing, itching, ear ache, nasal congestion, or post nasal drip CV:  No chest  pain, orthopnea, PND, swelling in lower extremities, anasarca, dizziness, palpitations, syncope Resp: +snoring. No shortness of breath with exertion or at rest. No cough GI:  No heartburn, indigestion GU: No nocturia  Skin: No rash, lesions, ulcerations MSK:  No joint pain or swelling.   Neuro: No dizziness or lightheadedness.  Psych: No depression or anxiety. Mood stable. +sleep disturbance     Physical Exam:  BP (!) 145/103 (BP  Location: Right Arm, Patient Position: Sitting, Cuff Size: Large)   Pulse (!) 117   Ht 6' 3 (1.905 m)   Wt (!) 373 lb 6.4 oz (169.4 kg)   SpO2 97%   BMI 46.67 kg/m   GEN: Pleasant, interactive, well-appearing; morbidly obese; in no acute distress HEENT:  Normocephalic and atraumatic. PERRLA. Sclera white. Nasal turbinates pink, moist and patent bilaterally. No rhinorrhea present. Oropharynx pink and moist, without exudate or edema. No lesions, ulcerations, or postnasal drip. Mallampati IV. Enlarged tonsils.  NECK:  Supple w/ fair ROM. No JVD present. Thyroid symmetrical with no goiter or nodules palpated. No lymphadenopathy.   CV: RRR, no m/r/g, no peripheral edema. Pulses intact, +2 bilaterally. No cyanosis, pallor or clubbing. PULMONARY:  Unlabored, regular breathing. Clear bilaterally A&P w/o wheezes/rales/rhonchi. No accessory muscle use.  GI: BS present and normoactive. Soft, non-tender to palpation. No organomegaly or masses detected.  MSK: No erythema, warmth or tenderness. Cap refil <2 sec all extrem.  Neuro: A/Ox3. No focal deficits noted.   Skin: Warm, no lesions or rashe Psych: Normal affect and behavior. Judgement and thought content appropriate.     Lab Results:  CBC    Component Value Date/Time   WBC 6.7 04/26/2024 1015   WBC 7.2 06/25/2010 0500   RBC 5.71 04/26/2024 1015   RBC 4.83 06/25/2010 0500   HGB 16.5 04/26/2024 1015   HCT 49.4 04/26/2024 1015   PLT 208 04/26/2024 1015   MCV 87 04/26/2024 1015   MCH 28.9 04/26/2024 1015   MCH 29.8 06/25/2010 0500   MCHC 33.4 04/26/2024 1015   MCHC 33.3 06/25/2010 0500   RDW 14.8 04/26/2024 1015   LYMPHSABS 2.6 04/26/2024 1015   MONOABS 0.7 06/24/2010 0446   EOSABS 0.2 04/26/2024 1015   BASOSABS 0.1 04/26/2024 1015    BMET    Component Value Date/Time   NA 140 04/26/2024 1015   K 4.1 04/26/2024 1015   CL 104 04/26/2024 1015   CO2 22 04/26/2024 1015   GLUCOSE 112 (H) 04/26/2024 1015   GLUCOSE 73 06/25/2010  0500   BUN 11 04/26/2024 1015   CREATININE 0.65 (L) 04/26/2024 1015   CALCIUM 9.0 04/26/2024 1015   GFRNONAA >60 06/25/2010 0500   GFRAA  06/25/2010 0500    >60        The eGFR has been calculated using the MDRD equation. This calculation has not been validated in all clinical situations. eGFR's persistently <60 mL/min signify possible Chronic Kidney Disease.    BNP No results found for: BNP   Imaging:  No results found.  Administration History     None           No data to display          No results found for: NITRICOXIDE      Assessment & Plan:   OSA (obstructive sleep apnea) Hx of very severe OSA; inadequate control on CPAP. Previously on BiPAP. Discontinued therapy years ago with worsening symptoms. He has snoring, excessive daytime sleepiness, nocturnal apneic events, restless sleep. BMI 46. History  of HTN. Given this,  I am concerned he still has sleep disordered breathing with obstructive sleep apnea. He will need a repeat sleep study for further evaluation.    - discussed how weight can impact sleep and risk for sleep disordered breathing - discussed options to assist with weight loss: combination of diet modification, cardiovascular and strength training exercises   - had an extensive discussion regarding the adverse health consequences related to untreated sleep disordered breathing - specifically discussed the risks for hypertension, coronary artery disease, cardiac dysrhythmias, cerebrovascular disease, and diabetes - lifestyle modification discussed   - discussed how sleep disruption can increase risk of accidents, particularly when driving - safe driving practices were discussed  Patient Instructions  Given your symptoms and history, I am concerned that you still have sleep disordered breathing with sleep apnea. You will need a sleep study for further evaluation. Someone will contact you to schedule this.   We discussed how untreated  sleep apnea puts an individual at risk for cardiac arrhthymias, pulm HTN, DM, stroke and increases their risk for daytime accidents. We also briefly reviewed treatment options including weight loss, side sleeping position, oral appliance, CPAP therapy or referral to ENT for possible surgical options  Use caution when driving and pull over if you become sleepy.  Follow up in 6 weeks with Katie Yaqub Arney,NP to go over sleep study results, or sooner, if needed. Friday PM virtual clinic preferred       Enlarged tonsils Referred to ENT  Morbid obesity (HCC) BMI 46. Healthy weight loss encouraged. He would be an appropriate candidate for GLP-1 with Zepbound to treat obesity in setting of OSA. Will readdress at follow up.   Excessive daytime sleepiness See above    Advised if symptoms do not improve or worsen, to please contact office for sooner follow up or seek emergency care.   I spent 45 minutes of dedicated to the care of this patient on the date of this encounter to include pre-visit review of records, face-to-face time with the patient discussing conditions above, post visit ordering of testing, clinical documentation with the electronic health record, making appropriate referrals as documented, and communicating necessary findings to members of the patients care team.  Comer LULLA Rouleau, NP 05/10/2024  Pt aware and understands NP's role.

## 2024-05-10 ENCOUNTER — Encounter: Payer: Self-pay | Admitting: Nurse Practitioner

## 2024-05-10 DIAGNOSIS — G4719 Other hypersomnia: Secondary | ICD-10-CM | POA: Insufficient documentation

## 2024-05-10 DIAGNOSIS — J351 Hypertrophy of tonsils: Secondary | ICD-10-CM | POA: Insufficient documentation

## 2024-05-10 NOTE — Assessment & Plan Note (Signed)
 BMI 46. Healthy weight loss encouraged. He would be an appropriate candidate for GLP-1 with Zepbound to treat obesity in setting of OSA. Will readdress at follow up.

## 2024-05-10 NOTE — Assessment & Plan Note (Signed)
 See above

## 2024-05-10 NOTE — Assessment & Plan Note (Signed)
 Referred to ENT

## 2024-05-10 NOTE — Assessment & Plan Note (Signed)
 Hx of very severe OSA; inadequate control on CPAP. Previously on BiPAP. Discontinued therapy years ago with worsening symptoms. He has snoring, excessive daytime sleepiness, nocturnal apneic events, restless sleep. BMI 46. History of HTN. Given this,  I am concerned he still has sleep disordered breathing with obstructive sleep apnea. He will need a repeat sleep study for further evaluation.    - discussed how weight can impact sleep and risk for sleep disordered breathing - discussed options to assist with weight loss: combination of diet modification, cardiovascular and strength training exercises   - had an extensive discussion regarding the adverse health consequences related to untreated sleep disordered breathing - specifically discussed the risks for hypertension, coronary artery disease, cardiac dysrhythmias, cerebrovascular disease, and diabetes - lifestyle modification discussed   - discussed how sleep disruption can increase risk of accidents, particularly when driving - safe driving practices were discussed  Patient Instructions  Given your symptoms and history, I am concerned that you still have sleep disordered breathing with sleep apnea. You will need a sleep study for further evaluation. Someone will contact you to schedule this.   We discussed how untreated sleep apnea puts an individual at risk for cardiac arrhthymias, pulm HTN, DM, stroke and increases their risk for daytime accidents. We also briefly reviewed treatment options including weight loss, side sleeping position, oral appliance, CPAP therapy or referral to ENT for possible surgical options  Use caution when driving and pull over if you become sleepy.  Follow up in 6 weeks with Katie Baldomero Mirarchi,NP to go over sleep study results, or sooner, if needed. Friday PM virtual clinic preferred

## 2024-05-22 ENCOUNTER — Ambulatory Visit (HOSPITAL_BASED_OUTPATIENT_CLINIC_OR_DEPARTMENT_OTHER): Admitting: Student

## 2024-05-23 ENCOUNTER — Ambulatory Visit (HOSPITAL_BASED_OUTPATIENT_CLINIC_OR_DEPARTMENT_OTHER): Admitting: Student

## 2024-05-28 ENCOUNTER — Encounter (HOSPITAL_BASED_OUTPATIENT_CLINIC_OR_DEPARTMENT_OTHER): Payer: Self-pay | Admitting: Student

## 2024-05-28 ENCOUNTER — Ambulatory Visit (HOSPITAL_BASED_OUTPATIENT_CLINIC_OR_DEPARTMENT_OTHER): Admitting: Student

## 2024-05-28 ENCOUNTER — Other Ambulatory Visit: Payer: Self-pay

## 2024-05-28 VITALS — BP 164/78 | HR 82 | Temp 98.0°F | Resp 16 | Ht 75.0 in | Wt 378.3 lb

## 2024-05-28 DIAGNOSIS — I10 Essential (primary) hypertension: Secondary | ICD-10-CM

## 2024-05-28 DIAGNOSIS — I493 Ventricular premature depolarization: Secondary | ICD-10-CM | POA: Insufficient documentation

## 2024-05-28 DIAGNOSIS — R7303 Prediabetes: Secondary | ICD-10-CM | POA: Insufficient documentation

## 2024-05-28 DIAGNOSIS — F901 Attention-deficit hyperactivity disorder, predominantly hyperactive type: Secondary | ICD-10-CM | POA: Diagnosis not present

## 2024-05-28 DIAGNOSIS — G479 Sleep disorder, unspecified: Secondary | ICD-10-CM | POA: Diagnosis not present

## 2024-05-28 MED ORDER — HYDROCHLOROTHIAZIDE 12.5 MG PO TABS: 12.5000 mg | ORAL_TABLET | Freq: Every day | ORAL | 4 refills | Status: DC

## 2024-05-28 NOTE — Assessment & Plan Note (Signed)
 Elevated blood glucose levels with non-fasting readings between 120-150 mg/dL and a previous fasting blood glucose of 112 mg/dL are not confirmatory for diabetes. Monitoring over the next three months will determine if a diagnosis of type 2 diabetes is warranted. Fasting glucose on CMP did not confirm t2dm. - Schedule a follow-up appointment in two months to reassess A1c and possibly confirm t2dm. - Discuss potential treatment and dietary changes if type 2 diabetes is confirmed.

## 2024-05-28 NOTE — Assessment & Plan Note (Signed)
 Irregular pulse on palpation. Denies chest pain or shortness of breath. Sleep apnea is suspected, which could contribute to abnormal heart rhythms and sleep disturbances. Asymptomatic per patient.  EKG: there are no previous tracings available for comparison, sinus tachycardia, frequent PVC's noted. - Perform ZIO based on frequent PVCs in both tracings taken - Stop focalin  XR until ZIO monitor results are back- concern for PVC burden

## 2024-05-28 NOTE — Progress Notes (Signed)
 Established Patient Office Visit  Subjective   Patient ID: Jack Cabrera, male    DOB: 09-25-1986  Age: 38 y.o. MRN: [id number]  Chief Complaint  Patient presents with   Medical Management of Chronic Issues    Follow up. Needs to discuss diabetes type 2 tx. Missed 2 days of BP med. Thinks it got mixed with wife's container. Refill needed for focalin  XR.     HPI  Discussed the use of AI scribe software for clinical note transcription with the patient, who gave verbal consent to proceed.  History of Present Illness   Jack Cabrera is a 37 year old male with ADHD and hypertension who presents for medication management and follow-up.  He uses delta-9 THC gummies primarily for relaxation and sleep, especially after his children go to bed or when he has a day off. He acknowledges that the gummies are strong and is open to reducing his use. He does not consume alcohol, citing a family history of alcoholism, and prefers to avoid substances that could lead to addiction.  He has been prescribed Focalin  XR for ADHD, which is effective in keeping him alert and focused throughout the day. He experiences a decrease in its effects around 3 to 4 PM, which he finds acceptable as it allows him to sleep better at night. No palpitations or other side effects from the medication.  For hypertension, he has missed his blood pressure medication for the past two days. His blood pressure has been slightly elevated, reaching up to 140 mmHg when active, but typically reduces after rest. He recalls previously being on a medication that included a diuretic, which effectively lowered his blood pressure without significant side effects. He prefers to start with a low dose to avoid past experiences of hypotension.  He mentions a recent incident involving a bobcat that resulted in back pain, which he is managing with Tylenol and ibuprofen. He prefers to start with over-the-counter medications before considering stronger  pain relief.  He has a history of elevated blood glucose levels, with non-fasting readings between 120 and 150 mg/dL. A previous fasting blood glucose was 112 mg/dL. He is monitoring his blood sugar levels and is aware of the potential for type 2 diabetes.  He is awaiting a sleep study appointment to address potential sleep apnea, which has not yet been scheduled. He reports difficulty sleeping, taking about 30 minutes to fall asleep, and has tried chamomile tea and magnesium supplements for sleep aid.        Patient Active Problem List   Diagnosis Date Noted   Difficulty sleeping 05/28/2024   PVC (premature ventricular contraction) 05/28/2024   Prediabetes 05/28/2024   Enlarged tonsils 05/10/2024   Excessive daytime sleepiness 05/10/2024   Adult general medical exam 04/26/2024   BMI 45.0-49.9, adult (HCC) 04/26/2024   Morbid obesity (HCC) 04/26/2024   OSA (obstructive sleep apnea) 03/14/2024   Nicotine abuse 03/14/2024   Primary hypertension 03/14/2024   Attention deficit hyperactivity disorder (ADHD), predominantly hyperactive type 03/14/2024   History of acute pancreatitis 03/14/2024   Past Medical History:  Diagnosis Date   Hypertension    Obesity    Social History   Tobacco Use   Smoking status: Some Days    Current packs/day: 0.25    Average packs/day: 0.3 packs/day for 23.5 years (5.9 ttl pk-yrs)    Types: Cigarettes    Start date: 2002    Passive exposure: Current   Tobacco comments:    1 pack per month 05/09/2024  KRD  Vaping Use   Vaping status: Never Used  Substance Use Topics   Alcohol use: Yes    Comment: brandy, occasionally   Drug use: Yes    Comment: THC, delta-9   No Known Allergies    ROS Per HPI.    Objective:     BP (!) 164/78   Pulse 82   Temp 98 F (36.7 C) (Oral)   Resp 16   Ht 6' 3 (1.905 m)   Wt (!) 378 lb 4.8 oz (171.6 kg)   SpO2 96%   BMI 47.28 kg/m  BP Readings from Last 3 Encounters:  05/28/24 (!) 164/78  05/09/24 (!)  145/103  04/26/24 (!) 145/94   Wt Readings from Last 3 Encounters:  05/28/24 (!) 378 lb 4.8 oz (171.6 kg)  05/09/24 (!) 373 lb 6.4 oz (169.4 kg)  04/26/24 (!) 373 lb 3.2 oz (169.3 kg)      Physical Exam Constitutional:      General: He is not in acute distress.    Appearance: Normal appearance. He is not ill-appearing.  HENT:     Head: Normocephalic and atraumatic.     Right Ear: External ear normal.     Left Ear: External ear normal.     Nose: Nose normal.  Eyes:     Conjunctiva/sclera: Conjunctivae normal.  Cardiovascular:     Heart sounds: Normal heart sounds. No murmur heard.    No friction rub.     Comments: Pulse abnormal- in and out of tachy throughout the exam Pulmonary:     Effort: Pulmonary effort is normal. No respiratory distress.     Breath sounds: Normal breath sounds. No wheezing, rhonchi or rales.  Musculoskeletal:     Right lower leg: No edema.     Left lower leg: No edema.  Skin:    General: Skin is warm and dry.     Coloration: Skin is not jaundiced or pale.  Neurological:     Mental Status: He is alert.  Psychiatric:        Mood and Affect: Mood normal.        Behavior: Behavior normal.      No results found for any visits on 05/28/24.  Last CBC Lab Results  Component Value Date   WBC 6.7 04/26/2024   HGB 16.5 04/26/2024   HCT 49.4 04/26/2024   MCV 87 04/26/2024   MCH 28.9 04/26/2024   RDW 14.8 04/26/2024   PLT 208 04/26/2024   Last metabolic panel Lab Results  Component Value Date   GLUCOSE 112 (H) 04/26/2024   NA 140 04/26/2024   K 4.1 04/26/2024   CL 104 04/26/2024   CO2 22 04/26/2024   BUN 11 04/26/2024   CREATININE 0.65 (L) 04/26/2024   EGFR 123 04/26/2024   CALCIUM 9.0 04/26/2024   PROT 6.7 04/26/2024   ALBUMIN 4.4 04/26/2024   LABGLOB 2.3 04/26/2024   BILITOT 0.5 04/26/2024   ALKPHOS 93 04/26/2024   AST 37 04/26/2024   ALT 50 (H) 04/26/2024   Last lipids Lab Results  Component Value Date   CHOL 162 04/26/2024    HDL 31 (L) 04/26/2024   LDLCALC 104 (H) 04/26/2024   TRIG 152 (H) 04/26/2024   CHOLHDL 5.2 (H) 04/26/2024   Last hemoglobin A1c Lab Results  Component Value Date   HGBA1C 6.8 (H) 04/26/2024      The ASCVD Risk score (Arnett DK, et al., 2019) failed to calculate for the following reasons:   Unable  to determine if patient is Non-Hispanic African American    Assessment & Plan:   Primary hypertension Assessment & Plan: Chronic, not at goal of <130/80. Hypertension management is complicated by missed medication doses, resulting in elevated readings of 130-140 mmHg. Previous hypotension occurred with a higher diuretic dose. Current medication is losartan , an angiotensin receptor blocker, without a diuretic. A low-dose diuretic will be reintroduced to manage blood pressure effectively, starting with a low dose to avoid hypotension. - Prescribe 12.5 mg hydrochlorothiazide , instruct to split the tablet in half for the first couple of weeks and monitor blood pressure. - Increase to a full tablet if blood pressure remains high. - Send prescription to [facility] pharmacy.  Orders: -     hydroCHLOROthiazide ; Take 1 tablet (12.5 mg total) by mouth daily. May stat with a half tablet for the first 2 weeks to see if you will tolerate it.  Dispense: 30 tablet; Refill: 4  Frequent PVCs Assessment & Plan: Irregular pulse on palpation. Denies chest pain or shortness of breath. Sleep apnea is suspected, which could contribute to abnormal heart rhythms and sleep disturbances. Asymptomatic per patient.  EKG: there are no previous tracings available for comparison, sinus tachycardia, frequent PVC's noted. - Perform ZIO based on frequent PVCs in both tracings taken - Stop focalin  XR until ZIO monitor results are back- concern for PVC burden  Orders: -     LONG TERM MONITOR (3-14 DAYS); Future  Attention deficit hyperactivity disorder (ADHD), predominantly hyperactive type Assessment & Plan: Chronic,  stable. ADHD is managed with Focalin  XR, effectively maintaining alertness and focus without causing palpitations. The medication wears off around 3-4 PM, allowing for better sleep. Delta-9 THC gummies are used for relaxation and sleep, but a reduction in usage is recommended as it may indicate self-treatment of an underlying issue better managed with regulated substances. Magnesium supplementation is suggested as a low-risk, high-reward option for sleep improvement. - Continue Focalin  XR at the current dose- though stop until zio results are back.. - Reduce delta-9 THC gummy usage. - Consider magnesium supplementation for sleep, 400 mg at night, 30 minutes before bedtime.   Difficulty sleeping  Prediabetes Assessment & Plan: Elevated blood glucose levels with non-fasting readings between 120-150 mg/dL and a previous fasting blood glucose of 112 mg/dL are not confirmatory for diabetes. Monitoring over the next three months will determine if a diagnosis of type 2 diabetes is warranted. Fasting glucose on CMP did not confirm t2dm. - Schedule a follow-up appointment in two months to reassess A1c and possibly confirm t2dm. - Discuss potential treatment and dietary changes if type 2 diabetes is confirmed.    Return in about 8 weeks (around 07/23/2024).    Hina Gupta T Takasha Vetere, PA-C

## 2024-05-28 NOTE — Assessment & Plan Note (Signed)
 Chronic, stable. ADHD is managed with Focalin  XR, effectively maintaining alertness and focus without causing palpitations. The medication wears off around 3-4 PM, allowing for better sleep. Delta-9 THC gummies are used for relaxation and sleep, but a reduction in usage is recommended as it may indicate self-treatment of an underlying issue better managed with regulated substances. Magnesium supplementation is suggested as a low-risk, high-reward option for sleep improvement. - Continue Focalin  XR at the current dose- though stop until zio results are back.. - Reduce delta-9 THC gummy usage. - Consider magnesium supplementation for sleep, 400 mg at night, 30 minutes before bedtime.

## 2024-05-28 NOTE — Assessment & Plan Note (Signed)
 Chronic, not at goal of <130/80. Hypertension management is complicated by missed medication doses, resulting in elevated readings of 130-140 mmHg. Previous hypotension occurred with a higher diuretic dose. Current medication is losartan , an angiotensin receptor blocker, without a diuretic. A low-dose diuretic will be reintroduced to manage blood pressure effectively, starting with a low dose to avoid hypotension. - Prescribe 12.5 mg hydrochlorothiazide , instruct to split the tablet in half for the first couple of weeks and monitor blood pressure. - Increase to a full tablet if blood pressure remains high. - Send prescription to [facility] pharmacy.

## 2024-06-07 ENCOUNTER — Ambulatory Visit

## 2024-06-07 DIAGNOSIS — I493 Ventricular premature depolarization: Secondary | ICD-10-CM

## 2024-06-11 ENCOUNTER — Ambulatory Visit: Attending: Student

## 2024-06-21 ENCOUNTER — Ambulatory Visit (HOSPITAL_BASED_OUTPATIENT_CLINIC_OR_DEPARTMENT_OTHER): Attending: Nurse Practitioner | Admitting: Internal Medicine

## 2024-06-21 VITALS — Ht 75.0 in | Wt 380.0 lb

## 2024-06-21 DIAGNOSIS — G4733 Obstructive sleep apnea (adult) (pediatric): Secondary | ICD-10-CM | POA: Insufficient documentation

## 2024-07-08 NOTE — Procedures (Signed)
 [facility] [address] [place], [place] [address] Tel: [phone]   Fax: [phone]  Split Night Interpretation  Patient Name:  Jack Cabrera, Jack Cabrera Date:  06/21/2024 Referring Physician:  KATHERINE COBB [phone]) %%startinterp%% Indications for Polysomnography The patient is a 38 year old Male who is 6' 3 and weighs 380.0 lbs.  His BMI equals 47.7.  A diagnostic polysomnogram was performed to evaluate for -.  After 125.5 minutes of sleep time the patient exhibited sufficient respiratory events qualifying him for a CPAP trial which was then initiated.    Medication  MELATONIN   Polysomnogram Data A full night polysomnogram was performed recording the standard physiologic parameters including EEG, EOG, EMG, EKG, nasal and oral airflow.  Respiratory parameters of chest and abdominal movements are recorded with Peizo-Crystal motion transducers.  Oxygen saturation was recorded by pulse oximetry.    Sleep Architecture The total recording time of the diagnostic portion of the study was 149.5 minutes.  The total sleep time was 125.5 minutes.  During the diagnostic portion of the study, the patient spent 5.2% of total sleep time in Stage N1, 94.8% in Stage N2, 0.0% in Stages N3, and 0.0% in REM.   Sleep latency was 11.5 minutes.  REM latency was - minutes.  Sleep Efficiency was 83.9%.  Wake after Sleep Onset time was 12.5 minutes.   At 11:42:48 PM the patient was placed on PAP treatment and was titrated at pressures ranging from 6/3* cm/H20 with supplemental oxygen at - up to 26/22/0** cm/H20 with supplemental oxygen at -.  The total recording time of the treatment portion of the study was 320.8 minutes.  The total sleep time was 291.0 minutes.  During the treatment portion of the study, the patient spent 2.1% of total sleep time in Stage N1, 22.7% in Stage N2, 13.4% in Stages N3, and 61.9% in REM.   Sleep latency was 8.0 minutes.  REM latency was  21.0 minutes.  Sleep Efficiency was 90.7%.  Wake after Sleep Onset time was 22.0 minutes.  Respiratory Events During the diagnostic portion of the study, the polysomnogram revealed a presence of - obstructive, 3 central, and 5 mixed apneas resulting in an Apnea index of 3.8 events per hour.  There were 262 hypopneas (>=3% desaturation and/or arousal) resulting in an Apnea\Hypopnea Index (AHI >=3% desaturation and/or arousal) of 129.1 events per hour.  There were 254 hypopneas (>=4% desaturation) resulting in an Apnea\Hypopnea Index (AHI >=4% desaturation) of 125.3 events per hour.  There were - Respiratory Effort Related Arousals resulting in a RERA index of - events per hour. The Respiratory Disturbance Index is 129.1 events per hour.  The snore index was - events per hour.  Mean oxygen saturation was 85.0%.  The lowest oxygen saturation during sleep was 63.0%.  Time spent <=88% oxygen saturation was 85.5 minutes (59.0%).  During the treatment portion of the study, the polysomnogram revealed a presence of 4 obstructive, 48 central, and - mixed apneas resulting in an Apnea index of 10.7 events per hour.  There were 227 hypopneas (>=3% desaturation and/or arousal) resulting in an Apnea\Hypopnea Index (AHI >=3% desaturation and/or arousal) of 57.5 events per hour.  There were 157 hypopneas (>=4% desaturation) resulting in an Apnea\Hypopnea Index (AHI >=4% desaturation) of 43.1 events per hour.  There were 8 Respiratory Effort Related Arousals resulting in a RERA index of 1.6 events per hour. The Respiratory Disturbance Index is 59.2 events per hour.  The snore index was -  events per hour.  Mean oxygen saturation was 92.6%.  The lowest oxygen saturation during sleep was 67.0%.  Time spent <=88% oxygen saturation was 37.4 minutes (12.0%).  Limb Activity During the diagnostic portion of the study, there were - limb movements recorded.  Of this total, - were classified as PLMs.  Of the PLMs, - were associated with  arousals.  The Limb Movement index was - per hour while the PLM index was - per hour.  During the treatment portion of the study, there were - limb movements recorded.  Of this total, - were classified as PLMs.  Of the PLMs, - were associated with arousals.  The Limb Movement index was - per hour while the PLM index was - per hour.  Cardiac Summary During the diagnostic portion of the study, the average pulse rate was 91.9 bpm.  The minimum pulse rate was 59.0 bpm while the maximum pulse rate was 107.0 bpm. PVCs with some trigeminy.  During the treatment portion of the study, the average pulse rate was 66.8 bpm.  The minimum pulse rate was 28.0 bpm while the maximum pulse rate was 106.0 bpm.   Comments: Severe obstructive sleep apnea, AHI (4%) 125.3/ hr with minimum O2 saturation 63%, mean 85%. CPAP titration provided inadequate control and was converted to BIPAP. Best available control was with BIPAP 25/21, PS 0- residual AHI (4%) 12.2/hr, O2 saturation nadir 90%, mean 95.1%.  Diagnosis: Obstructive sleep apnea  Recommendations: Suggest BIPAP 25/21 or autoBIPAP. Patient wore a medium ResMed AirFit P30i nasal pillow mask, extra large chin strap, heated humidification.   This study was personally reviewed and electronically signed by: Dr. Reggy Salt Accredited Board Certified in Sleep Medicine Date/Time: 07/08/24  12:02    %%endinterp%%  Split Night Report  Patient Name: Jack Cabrera, Jack Cabrera Study Date: 06/21/2024  Date of Birth: 27-Oct-1986 Study Type: Split Night  Age: 38 year MRN #: [id number]  Sex: Male Interpreting Physician: SALT REGGY, [id number]  Height: 6' 3 Referring Physician: KATHERINE COBB [phone])  Weight: 380.0 lbs Recording Tech: Dewane Hacker CRT RPSGT RST  BMI: 47.7 Scoring Tech: Dewane Hacker CRT RPSGT RST  ESS: 8 Neck Size: 20  Mask Type RESMED AIRFIT P30i NASAL PILLOW MASK Final Pressure: 27/23cmH2O  Mask Size: MEDIUM Supplemental O2: N/A   Study  Overview  DIAGNOSTIC TREATMENT  Lights Off: 09:13:00 PM Lights Off: 11:42:31 PM  Lights On: 11:42:31 PM Lights On: 05:03:21 AM  Time in Bed: 149.5 min. Time in Bed: 320.8 min.  Total Sleep Time: 125.5 min. Total Sleep Time: 291.0 min.  Sleep Efficiency: 83.9% Sleep Efficiency: 90.7%  Sleep Latency: 11.5 min. Sleep Latency: 8.0 min.  REM Latency from Sleep Onset: - min. REM Latency from Sleep Onset: 21.0 min.  Wake After Sleep Onset: 12.5 min. Wake After Sleep Onset: 22.0 min.   DIAGNOSTIC TREATMENT   Count Index  Count Index  Awakenings: 6 2.9 Awakenings: 7 1.4  Arousals: 236 112.8 Arousals: 65 13.4  AHI (>=3% Desat and/or Ar.): 270 129.1 AHI (>=3% Desat and/or Ar.): 279 57.5  AHI (>=4% Desat): 262 125.3 AHI (>=4% Desat): 209 43.1   Limb Movements: - - Limb Movements: - -  Snore: - - Snore: - -  Desaturations: 270 129.1 Desaturations: 288 59.4  Minimum SpO2 TST: 63.0% Minimum SpO2 TST: 67.0%    Sleep Architecture   DIAGNOSTIC TREATMENT ENTIRE NIGHT  Stages Time (mins) % Sleep Time Time (mins) % Sleep Time Time (mins) % Sleep Time  Wake 24.0  30.0  54.0   Stage N1 6.5 5.2% 6.0 2.1% 12.5 3.0%  Stage N2 119.0 94.8% 66.0 22.7% 185.0 44.4%  Stage N3 0.0 0.0% 39.0 13.4% 39.0 9.4%  REM 0.0 0.0% 180.0 61.9% 180.0 43.2%   Arousal Summary   DIAGNOSTIC TREATMENT   NREM REM TST Index NREM REM TST Index  Respiratory Ar. 221 - 221 105.7 28 12  40 8.2  PLM Ar. - - - - - - - -  Isolated Limb Movement Ar. - - - - - - - -  Snore Ar. - - - - - - - -  Spontaneous Ar. 15 - 15 7.2 11 14 25  5.2  Total Ar. 236 - 236 112.8 39 26 65 13.4    Respiratory Summary  DIAGNOSTIC By Sleep Stage By Body Position Total   NREM REM Supine Non-Supine   Time (min) 125.5 0.0 125.5 - 125.5         Obstructive Apnea - - - - -  Mixed Apnea 5 - 5 - 5  Central Apnea 3 - 3 - 3  Total Apneas 8 - 8 - 8  Total Apnea Index 3.8 - 3.8 - 3.8         Hypopneas (>=3% Desat and/or Ar.) 262 - 262 - 262  AHI (>=3%  Desat and/or Ar.) 129.1 - 129.1 - 129.1         Hypopneas (>=4% Desat) 254 - 254 - 254  AHI (>=4% Desat) 125.3 - 125.3 - 125.3          RERAs - - - - -  RERA Index - - - - -         RDI 129.1 - 129.1 - 129.1    TREATMENT By Sleep Stage By Body Position Total   NREM REM Supine Non-Supine   Time (min) 111.0 180.0 291.0 - 291.0         Obstructive Apnea 4 - 4 - 4  Mixed Apnea - - - - -  Central Apnea 37 11 48 - 48  Total Apneas 41 11 52 - 52  Total Apnea Index 22.2 3.7 10.7 - 10.7         Hypopneas (>=3% Desat and/or Ar.) 103 124 227 - 227  AHI (>=3% Desat and/or Ar.) 77.8 45.0 57.5 - 57.5         Hypopneas (>=4% Desat) 79 78 157 - 157  AHI (>=4% Desat) 64.9 29.7 43.1 - 43.1          RERAs - 8 8 - 8  RERA Index - 2.7 1.6 - 1.6         RDI 77.8 47.7 59.2 - 59.2    Respiratory Event Durations   DIAGNOSTIC TREATMENT  Apnea NREM REM NREM REM  Average (seconds) 21.0 - 18.0 13.4  Maximum (seconds) 25.3 - 28.7 19.1  Hypopnea      Average (seconds) 21.6 - 21.2 18.8  Maximum (seconds) 48.9 - 48.7 35.1    Limb Movement Summary   DIAGNOSTIC TREATMENT   Count Index Count Index  Isolated Limb Movements - - - -  Periodic Limb Movements (PLMs) - - - -  Total Limb Movements - - - -    Oxygen Saturation Summary   DIAGNOSTIC TREATMENT   Wake NREM REM TST Wake NREM REM TST  Average SpO2 90.7% 84.1% - 84.1% 93.8% 93.1% 92.1% 92.5%  Minimum SpO2 69.0% 63.0% - 63.0%  79.0% 67.0% 70.0% 67.0%   Maximum SpO2 98.0% 98.0% - 98.0%  98.0% 98.0% 98.0% 98.0%    DIAGNOSTIC Oxygen Saturation Distribution  Range (%) Time in range (min) Time in range (%)   90.0 - 100.0 48.4 33.4%  80.0 - 90.0 52.0 35.9%  70.0 - 80.0 37.8 26.1%  60.0 - 70.0 6.7 4.6%  50.0 - 60.0 - -  0.0 - 50.0 - -  Time Spent <=88% SpO2  Range (%) Time in range (min) Time in range (%)  0.0 - 88.0 85.5 59.0%      Count Index  Desaturations: 270 129.1   TREATMENT Oxygen Saturation Distribution  Range (%) Time  in range (min) Time in range (%)   90.0 - 100.0 251.0 80.3%  80.0 - 90.0 52.6 16.8%  70.0 - 80.0 8.7 2.8%  60.0 - 70.0 0.2 0.1%  50.0 - 60.0 - -  0.0 - 50.0 - -  Time Spent <=88% SpO2  Range (%) Time in range (min) Time in range (%)  0.0 - 88.0 37.4 12.0%      Count Index  Desaturations: 288 59.4     Cardiac Summary   DIAGNOSTIC TREATMENT   Wake NREM REM Total Wake NREM REM Total  Average Pulse Rate (BPM) 92.5 91.8 - 91.9 76.4 66.6 65.5 66.8  Minimum Pulse Rate (BPM) 65.0 59.0 - 59.0 32.0 30.0 28.0 28.0  Maximum Pulse Rate (BPM) 106.0 107.0 - 107.0 105.0 106.0 104.0 106.0   Pulse Rate Distribution   DIAGNOSTIC  Range (bpm) Time in range (min) Time in range (%)  0.0 - 40.0 - -  40.0 - 60.0 0.0 0.0%  60.0 - 80.0 3.7 2.6%  80.0 - 100.0 132.7 91.6%  100.0 - 120.0 7.1 4.9%  120.0 - 140.0 - -  140.0 - 200.0 - -   TREATMENT  Range (bpm) Time in range (min) Time in range (%)  0.0 - 40.0 43.1 13.7%  40.0 - 60.0 67.8 21.6%  60.0 - 80.0 69.5 22.1%  80.0 - 100.0 99.6 31.7%  100.0 - 120.0 1.5 0.5%  120.0 - 140.0 - -  140.0 - 200.0 - -    Titration Summary  PAP Device PAP Level O2 Level Time (min) Wake (min) NREM (min) REM (min) Sleep Eff% OA# CA# MA# Hyp# (>=3%) AHI (>=3%) Hyp# (>=4%) AHI (>=%4) RERA RDI OSat <=88% (min) Min [employer] Ar. Index  - Off - 150.0 24.0 125.5 0.0 83.7% - 3 5 262 129.1 254  125.3 -  129.1  81.8 63.0 84.1 112.8  CPAP EPR 6/3 - 24.0 11.0 13.0 0.0 54.2% - 5 - 21 120.0 20  115.4 -  120.0  5.8 77.0 89.1 96.9  CPAP EPR 8/3 - 11.0 0.0 6.5 4.5 100.0% - 2 - 22 130.9 17  103.6 -  130.9  8.4 67.0 82.3 60.0  CPAP EPR 10/3 - 10.0 0.0 0.0 10.0 100.0% - - - 16 96.0 11  66.0 1  102.0  9.8 74.0 81.6 6.0  CPAP EPR 12/3 - 11.5 0.0 0.0 11.5 100.0% - 1 - 14 78.3 8  47.0 -  78.3  4.6 79.0 88.5 5.2  CPAP EPR 14/3 - 11.5 0.0 0.0 11.5 100.0% - - - 18 93.9 12  62.6 -  93.9  3.0 83.0 89.8 5.2  CPAP EPR 15/3 - 10.5 0.0 0.0 10.5 100.0% - - - 12 68.6 7  40.0 1  74.3   2.9 85.0 89.9 -  BiLevel 19/15/0 - 26.5 4.5 12.0 10.0 83.0% - 8 - 23 84.5 20  76.4 -  84.5  1.6 83.0 91.9 10.9  BiLevel 20/16/0 - 14.5 0.0 0.0 14.5 100.0% - 1 - 10 45.5 6  29.0 -  45.5  0.0 88.0 92.1 -  BiLevel 21/17/0 - 43.0 14.0 15.0 14.0 67.4% - 11 - 30 84.8 21  66.2 -  84.8  0.1 87.0 93.2 4.1  BiLevel 22/18/0 - 11.0 0.0 11.0 0.0 100.0% - 1 - 12 70.9 4  27.3 -  70.9  0.0 91.0 94.1 5.5  BiLevel 23/19/0 - 20.0 0.0 20.0 0.0 100.0% - 2 - 13 45.0 9  33.0 -  45.0  0.0 91.0 94.2 3.0  BiLevel 24/20/0 - 38.5 0.5 3.5 34.5 98.7% - 1 - 9 15.8 8  14.2 2  18.9  0.0 91.0 94.9 9.5  BiLevel 25/21/0 - 59.0 0.0 0.0 59.0 100.0% - 3 - 19 22.4 9  12.2 4  26.4  0.0 90.0 95.1 11.2  BiLevel 26/22/0 - 30.0 0.0 30.0 0.0 100.0% 4 13 - 8 50.0 5  44.0 -  50.0  0.0 91.0 95.3 10.0    Hypnograms                           Technologist Comments  THE 39-YEAR-OLD MALE PATIENT PRESENTED TO THE SLEEP DISORDER CENTER FOR A SPLIT NIGHT STUDY WITH A CHIEF COMPLAINT OF SEVERE OSA. THE STUDY WAS EXPLAINED, THEN THE PATIENT WAS FITTED WITH A MEDIUM RESMED AIRFIT P30i NASAL PILLOW MASK FOR PRE-CPAP TRIAL, WHICH WAS TOLERATED WELL. ALL BEDTIME MEDICATIONS WERE SELF ADMINISTERED AT 2000. THE LEAD PLACEMENT WAS INITIATED, THEN THE STUDY WAS BEGUN. LOUD AUDIBLE SNORING WAS NOTED PRIOR TO CPAP. SUPPLEMENTAL OXYGEN WAS NOT WARRANTED DURING THE STUDY. NO PLMs - PLMAs WERE NOTED. NO OBVIOUS PARASOMNIAs WERE OBSERVED. ONE RESTROOM VISIT WAS MADE. SINUS BRADYCARDIA WITH FREQUENT PVCs (TRIGEMINY) WERE NOTED THROUGHOUT THE STUDY. SPLIT NIGHT CRITERIA WAS MET AT 2336. THE PATIENT WAS PLACED ON CPAP UTILIZING THE ABOVE MASK TYPE ON A PRESSURE OF 6cmH2O WITH AN EPR OF 3cmH2O AND HEATED HUMIDIFICATION. THE CPAP PRESSURE WAS INCREASED FOR SNORING, RESPIRATORY EVENTS AND RERAs UNTIL 15cmH2O. THE PATIENT WAS THEN CHANGED TO BILEVEL OF 19/15cmH2O FOR INCREASED PRESSURES NEEDED TO ELIMINATE RESPIRATORY EVENTS. DURING REM, THE PATIENT WAS  OBSERVED ORAL VENTING SO A MEDIUM RESMED AIRFIT K69p AND A MEDIUM [employer] SIMPLUS FULL FACE MASKS WERE TRIED TO ADDRESS THE LEAKAGE WITHOUT SUCCESS. A CHIN STRAP WAS THEN ATTEMPTED, BUT THE CORRECT SIZE WAS NOT AVAILABLE IN THE CENTER. COBAN TAPE WAS USED AS A CHIN RESTRAINT FOR THE ORAL BREATHING. THE PATIENT WOULD BENEFIT FROM AN EXTRA-LARGE CHIN STRAP. THE BILEVEL PRESSURE WAS INCREASED UNTIL OPTIMAL PRESSURE WAS ACHIEVED. THE PATIENT TOLERATED THE CPAP/ BILEVEL TRIAL WELL.                            Reggy Salt Diplomate, Biomedical engineer of Sleep Medicine  ELECTRONICALLY SIGNED ON:  07/08/2024, 12:03 PM [facility] PH: [phone]   FX: [phone] ACCREDITED BY THE AMERICAN ACADEMY OF SLEEP MEDICINE

## 2024-07-08 NOTE — Procedures (Signed)
 Indications for Polysomnography The patient is a 38 year old Male who is 6' 3 and weighs 380.0 lbs.  His BMI equals 47.7.  A diagnostic polysomnogram was performed to evaluate for -.  After 125.5 minutes of sleep time the patient exhibited sufficient respiratory events qualifying him  for a CPAP trial which was then initiated.  MedicationMELATONIN Polysomnogram Data A full night polysomnogram was performed recording the standard physiologic parameters including EEG, EOG, EMG, EKG, nasal and oral airflow.  Respiratory parameters of chest and abdominal movements are recorded with Peizo-Crystal motion transducers.   Oxygen saturation was recorded by pulse oximetry.  Sleep Architecture The total recording time of the diagnostic portion of the study was 149.5 minutes.  The total sleep time was 125.5 minutes.  During the diagnostic portion of the study, the patient spent 5.2% of total sleep time in Stage N1, 94.8% in Stage N2, 0.0% in  Stages N3, and 0.0% in REM.   Sleep latency was 11.5 minutes.  REM latency was - minutes.  Sleep Efficiency was 83.9%.  Wake after Sleep Onset time was 12.5 minutes.  At 11:42:48 PM the patient was placed on PAP treatment and was titrated at pressures ranging from 6/3* cm/H20 with supplemental oxygen at - up to 26/22/0** cm/H20 with supplemental oxygen at -.  The total recording time of the treatment portion of the  study was 320.8 minutes.  The total sleep time was 291.0 minutes.  During the treatment portion of the study, the patient spent 2.1% of total sleep time in Stage N1, 22.7% in Stage N2, 13.4% in Stages N3, and 61.9% in REM.   Sleep latency was 8.0  minutes.  REM latency was 21.0 minutes.  Sleep Efficiency was 90.7%.  Wake after Sleep Onset time was 22.0 minutes.  Respiratory Events During the diagnostic portion of the study, the polysomnogram revealed a presence of - obstructive, 3 central, and 5 mixed apneas resulting in an Apnea index of 3.8 events per  hour.  There were 262 hypopneas (GreaterEqual to3% desaturation and/or  arousal) resulting in an Apnea\Hypopnea Index (AHI GreaterEqual to3% desaturation and/or arousal) of 129.1 events per hour.  There were 254 hypopneas (GreaterEqual to4% desaturation) resulting in an Apnea\Hypopnea Index (AHI GreaterEqual to4%  desaturation) of 125.3 events per hour.  There were - Respiratory Effort Related Arousals resulting in a RERA index of - events per hour. The Respiratory Disturbance Index is 129.1 events per hour.  The snore index was - events per hour.  Mean oxygen  saturation was 85.0%.  The lowest oxygen saturation during sleep was 63.0%.  Time spent LessEqual to88% oxygen saturation was  minutes ().  During the treatment portion of the study, the polysomnogram revealed a presence of 4 obstructive, 48 central, and - mixed apneas resulting in an Apnea index of 10.7 events per hour.  There were 227 hypopneas (GreaterEqual to3% desaturation and/or  arousal) resulting in an Apnea\Hypopnea Index (AHI GreaterEqual to3% desaturation and/or arousal) of 57.5 events per hour.  There were 157 hypopneas (GreaterEqual to4% desaturation) resulting in an Apnea\Hypopnea Index (AHI GreaterEqual to4%  desaturation) of 43.1 events per hour.  There were 8 Respiratory Effort Related Arousals resulting in a RERA index of 1.6 events per hour. The Respiratory Disturbance Index is 59.2 events per hour.  The snore index was - events per hour.  Mean oxygen  saturation was 92.6%.  The lowest oxygen saturation during sleep was 67.0%.  Time spent LessEqual to88% oxygen saturation was  minutes ().  Limb Activity  During the diagnostic portion of the study, there were - limb movements recorded.  Of this total, - were classified as PLMs.  Of the PLMs, - were associated with arousals.  The Limb Movement index was - per hour while the PLM index was - per hour.  During the treatment portion of the study, there were - limb movements recorded.   Of this total, - were classified as PLMs.  Of the PLMs, - were associated with arousals.  The Limb Movement index was - per hour while the PLM index was - per hour.  Cardiac Summary During the diagnostic portion of the study, the average pulse rate was 91.9 bpm.  The minimum pulse rate was 59.0 bpm while the maximum pulse rate was 107.0 bpm.  During the treatment portion of the study, the average pulse rate was 66.8 bpm.  The minimum pulse rate was 28.0 bpm while the maximum pulse rate was 106.0 bpm.  Comments:  Diagnosis:  Recommendations:   This study was personally reviewed and electronically signed by: Dr. Reggy Salt Accredited Board Certified in Sleep Medicine Date/Time:

## 2024-07-17 ENCOUNTER — Ambulatory Visit (INDEPENDENT_AMBULATORY_CARE_PROVIDER_SITE_OTHER): Admitting: Otolaryngology

## 2024-07-17 VITALS — BP 142/86 | HR 78

## 2024-07-17 DIAGNOSIS — K219 Gastro-esophageal reflux disease without esophagitis: Secondary | ICD-10-CM

## 2024-07-17 DIAGNOSIS — G4733 Obstructive sleep apnea (adult) (pediatric): Secondary | ICD-10-CM

## 2024-07-17 NOTE — Progress Notes (Signed)
 ENT CONSULT:  Reason for Consult: OSA upper airway evaluation    HPI: Discussed the use of AI scribe software for clinical note transcription with the patient, who gave verbal consent to proceed.  History of Present Illness Jack Cabrera is a 38 year old male with severe sleep apnea AHI of 125 on recent in-lab sleep study, who presents for evaluation of upper airway obstruction. He was referred by Dr. Neysa for evaluation of his upper airway to address potential causes of obstruction such as enlarged tonsils.  He has been diagnosed with severe sleep apnea, with an apnea-hypopnea index (AHI) of 125, which is considered unusually high. He has not yet been provided with a CPAP machine and has not started any treatment for his condition.  He experiences occasional sore throat and tongue swelling, particularly after snoring excessively. He has a long-standing history of heartburn or reflux, for which he recently started a new diet to manage the symptoms. He experiences allergies occasionally, but they are generally well-tolerated.  He reports occasional sore throat and tongue swelling, particularly after snoring excessively. He is scheduled to follow up with Dorothyann Rouleau at [facility] on September 3rd.    AHI of 125 4% Records Reviewed:  Visit with Comer Rouleau 05/09/24 38 year old male, some day smoker referred for sleep consult. Past medical history significant for sleep apnea previously on BiPAP, HTN, obesity, hx of pancreatitis, ADHD.    TEST/EVENTS:  09/25/2015 split night sleep study: AHI 99.2 >> inadequate control on CPAP with emergent central apneas. Improved on BiPAP but full titration not completed   05/09/2024: Today - sleep consult Discussed the use of AI scribe software for clinical note transcription with the patient, who gave verbal consent to proceed.   History of Present Illness   Jack Cabrera is a 38 year old male with significant sleep apnea who presents for  reassessment of his condition.   He has a history of significant sleep apnea and was previously on BiPAP therapy, which he discontinued approximately six years ago due to nasal issues and frequent travel. Since discontinuing BiPAP, he has gained about weight, with his weight being 340 pounds during his initial sleep studies.   He experiences daytime fatigue, which he attributes to having three young children. No drowsy driving or falling asleep while driving. He does not take any sleep medications. He experiences frequent awakenings at night, either due to himself or his children waking him up, but he falls asleep relatively easily. He consumes a significant amount of caffeine daily, including coffee, mushroom coffee, and espresso, estimating his intake to be about a pot a day.    Past Medical History:  Diagnosis Date   Hypertension    Obesity     Past Surgical History:  Procedure Laterality Date   CHOLECYSTECTOMY      Family History  Problem Relation Age of Onset   Hypertension Maternal Grandfather    Diabetes Paternal Grandmother     Social History:  reports that he has been smoking cigarettes. He started smoking about 23 years ago. He has a 5.9 pack-year smoking history. He has been exposed to tobacco smoke. He does not have any smokeless tobacco history on file. He reports current alcohol use. He reports current drug use.  Allergies: No Known Allergies  Medications: I have reviewed the patient's current medications.  The PMH, PSH, Medications, Allergies, and SH were reviewed and updated.  ROS: Constitutional: Negative for fever, weight loss and weight gain. Cardiovascular: Negative for chest  pain and dyspnea on exertion. Respiratory: Is not experiencing shortness of breath at rest. Gastrointestinal: Negative for nausea and vomiting. Neurological: Negative for headaches. Psychiatric: The patient is not nervous/anxious  Blood pressure (!) 142/86, pulse 78, SpO2  97%.  PHYSICAL EXAM:  Exam: General: Well-developed, well-nourished Communication and Voice: Clear pitch and clarity Respiratory Respiratory effort: Equal inspiration and expiration without stridor Cardiovascular Peripheral Vascular: Warm extremities with equal color/perfusion Eyes: No nystagmus with equal extraocular motion bilaterally Neuro/Psych/Balance: Patient oriented to person, place, and time; Appropriate mood and affect; Gait is intact with no imbalance; Cranial nerves I-XII are intact Head and Face Inspection: Normocephalic and atraumatic without mass or lesion Palpation: Facial skeleton intact without bony stepoffs Salivary Glands: No mass or tenderness Facial Strength: Facial motility symmetric and full bilaterally ENT Pinna: External ear intact and fully developed External canal: Canal is patent with intact skin Tympanic Membrane: Clear and mobile External Nose: No scar or anatomic deformity Internal Nose: Septum intact and midline. No edema, polyp, or rhinorrhea Lips, Teeth, and gums: Mucosa and teeth intact and viable TMJ: No pain to palpation with full mobility Oral cavity/oropharynx: No erythema or exudate, no lesions present small amount of tonsillar tissue b/l Friedman IV tongue position  Neck Neck and Trachea: Midline trachea without mass or lesion Thyroid: No mass or nodularity Lymphatics: No lymphadenopathy   Studies Reviewed: Sleep study Dr Neysa 06/21/24 in-lab Respiratory Events During the diagnostic portion of the study, the polysomnogram revealed a presence of - obstructive, 3 central, and 5 mixed apneas resulting in an Apnea index of 3.8 events per hour.  There were 262 hypopneas (>=3% desaturation and/or arousal) resulting in an Apnea\Hypopnea Index (AHI >=3% desaturation and/or arousal) of 129.1 events per hour.  There were 254 hypopneas (>=4% desaturation) resulting in an Apnea\Hypopnea Index (AHI >=4% desaturation) of 125.3 events per hour.  There  were - Respiratory Effort Related Arousals resulting in a RERA index of - events per hour. The Respiratory Disturbance Index is 129.1 events per hour.  The snore index was - events per hour.  Mean oxygen saturation was 85.0%.  The lowest oxygen saturation during sleep was 63.0%.  Time spent <=88% oxygen saturation was 85.5 minutes (59.0%).   During the treatment portion of the study, the polysomnogram revealed a presence of 4 obstructive, 48 central, and - mixed apneas resulting in an Apnea index of 10.7 events per hour.  There were 227 hypopneas (>=3% desaturation and/or arousal) resulting in an Apnea\Hypopnea Index (AHI >=3% desaturation and/or arousal) of 57.5 events per hour.  There were 157 hypopneas (>=4% desaturation) resulting in an Apnea\Hypopnea Index (AHI >=4% desaturation) of 43.1 events per hour.  There were 8 Respiratory Effort Related Arousals resulting in a RERA index of 1.6 events per hour. The Respiratory Disturbance Index is 59.2 events per hour.  The snore index was - events per hour.  Mean oxygen saturation was 92.6%.  The lowest oxygen saturation during sleep was 67.0%.  Time spent <=88% oxygen saturation was 37.4 minutes (12.0%).    Assessment/Plan: Encounter Diagnoses  Name Primary?   Obstructive sleep apnea Yes   Chronic GERD     Assessment and Plan Assessment & Plan Obstructive sleep apnea Severe Severe obstructive sleep apnea with AHI of 125. BMI of 46. Previously on BiPAP, waiting for new CPAP machine from [place] 9visit 07/25/24). Uvula prominent but no tonsillar hypertrophy on exam.  - Initiate CPAP/BiPAP therapy - Coordinate with Dr. Saundra office at [facility] for CPAP initiation.  Gastroesophageal  reflux disease Long-standing condition with recent dietary changes for symptom management. -  Reflux Gourmet after meals - diet and lifestyle changes to minimize GERD - Refer to [employer] blog for dietary and lifestyle modifications/reflux cook  book    Thank you for allowing me to participate in the care of this patient. Please do not hesitate to contact me with any questions or concerns.   Elena Larry, MD Otolaryngology [facility] Phone: [phone] Fax: [phone]    07/17/2024, 11:57 AM

## 2024-07-17 NOTE — Patient Instructions (Signed)

## 2024-07-24 ENCOUNTER — Ambulatory Visit (HOSPITAL_BASED_OUTPATIENT_CLINIC_OR_DEPARTMENT_OTHER): Admitting: Student

## 2024-07-25 ENCOUNTER — Encounter: Payer: Self-pay | Admitting: Nurse Practitioner

## 2024-07-25 ENCOUNTER — Ambulatory Visit (INDEPENDENT_AMBULATORY_CARE_PROVIDER_SITE_OTHER): Admitting: Nurse Practitioner

## 2024-07-25 VITALS — BP 143/85 | HR 95 | Temp 97.8°F | Ht 75.0 in | Wt 362.8 lb

## 2024-07-25 DIAGNOSIS — G4733 Obstructive sleep apnea (adult) (pediatric): Secondary | ICD-10-CM | POA: Diagnosis not present

## 2024-07-25 DIAGNOSIS — Z6841 Body Mass Index (BMI) 40.0 and over, adult: Secondary | ICD-10-CM

## 2024-07-25 NOTE — Progress Notes (Signed)
 @Patient  ID: Jack Cabrera, male    DOB: 13-Nov-1986, 38 y.o.   MRN: [id number]  No chief complaint on file.   Referring provider: Rothfuss, Jacob T, PA-C  HPI: 38 year old male, some day smoker followed for very severe OSA. Past medical history significant for sleep apnea previously on BiPAP, HTN, obesity, hx of pancreatitis, ADHD.   TEST/EVENTS:  09/25/2015 split night sleep study: AHI 99.2 >> inadequate control on CPAP with emergent central apneas. Improved on BiPAP but full titration not completed 06/21/2024 Split Night Study: AHI 125.3/h, SpO2 low 63% with mean 85% >> inadequate control CPAP; transitioned to BiPAP 25/21 with residual AHI 12.2/h  05/09/2024: OV with Addalyn Speedy NP Discussed the use of AI scribe software for clinical note transcription with the patient, who gave verbal consent to proceed. Jack Cabrera is a 38 year old male with significant sleep apnea who presents for reassessment of his condition. He has a history of significant sleep apnea and was previously on BiPAP therapy, which he discontinued approximately six years ago due to nasal issues and frequent travel. Since discontinuing BiPAP, he has gained about weight, with his weight being 340 pounds during his initial sleep studies. He experiences daytime fatigue, which he attributes to having three young children. No drowsy driving or falling asleep while driving. He does not take any sleep medications. He experiences frequent awakenings at night, either due to himself or his children waking him up, but he falls asleep relatively easily. He consumes a significant amount of caffeine daily, including coffee, mushroom coffee, and espresso, estimating his intake to be about a pot a day. 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 He notes that he cannot fall asleep unless he is ready to sleep. He does snore loudly. Has been told he stops  breathing. No drowsy driving or sleep parasomnias/paralysis. He felt better when he was on BiPAP in the past. He would be open to resuming therapy.  He goes to bed between 9pm and midnight. Falls asleep within 20-30 min. Gets up multiple times a night. Wakes around 6-630 am. Last sleep study in 2016. [facility]  07/25/2024: Today - follow up Discussed the use of AI scribe software for clinical note transcription with the patient, who gave verbal consent to proceed.  History of Present Illness Jack Cabrera is a 38 year old male with severe sleep apnea who presents for follow-up after a sleep study.  He underwent a sleep study that revealed very severe sleep apnea with approximately 125/h. His oxygen saturation levels dropped to 63% during these events.   During the sleep study, initial treatment with CPAP was used and inadequate. Subsequently, he was transitioned to BiPAP therapy during the sleep study. He has a history of using BiPAP in the past and is familiar with its maintenance, including daily water changes with distilled water and regular cleaning to prevent mold growth.  He feels unchanged compared to his last. No drowsy driving.     No Known Allergies  Immunization History  Administered Date(s) Administered   Tdap 09/27/2018    Past Medical History:  Diagnosis Date   Hypertension    Obesity     Tobacco History: Social History   Tobacco Use  Smoking Status Some Days   Current packs/day: 0.25   Average packs/day: 0.3 packs/day for 23.7 years (5.9 ttl pk-yrs)   Types: Cigarettes   Start date: 2002   Passive  exposure: Current  Smokeless Tobacco Not on file  Tobacco Comments   1 pack per month 05/09/2024 KRD   Ready to quit: Not Answered Counseling given: Not Answered Tobacco comments: 1 pack per month 05/09/2024 KRD   Outpatient Medications Prior to Visit  Medication Sig Dispense Refill   hydrochlorothiazide  (HYDRODIURIL ) 12.5 MG tablet Take 1 tablet (12.5 mg  total) by mouth daily. May stat with a half tablet for the first 2 weeks to see if you will tolerate it. 30 tablet 4   losartan  (COZAAR ) 50 MG tablet Take 1 tablet (50 mg total) by mouth daily. 30 tablet 5   dexmethylphenidate  (FOCALIN  XR) 10 MG 24 hr capsule Take 1 capsule (10 mg total) by mouth daily. (Patient not taking: Reported on 07/25/2024) 30 capsule 0   No facility-administered medications prior to visit.     Review of Systems:   Constitutional: No night sweats, fevers, chills,  or lassitude. +weight gain, fatigue  HEENT: No headaches, difficulty swallowing, tooth/dental problems, or sore throat. No sneezing, itching, ear ache, nasal congestion, or post nasal drip CV:  No chest pain, orthopnea, PND, swelling in lower extremities, anasarca, dizziness, palpitations, syncope Resp: +snoring. No shortness of breath with exertion or at rest. No cough GI:  No heartburn, indigestion GU: No nocturia  Skin: No rash, lesions, ulcerations MSK:  No joint pain or swelling.   Neuro: No dizziness or lightheadedness.  Psych: No depression or anxiety. Mood stable. +sleep disturbance     Physical Exam:  BP (!) 143/85   Pulse 95   Temp 97.8 F (36.6 C) (Temporal)   Ht 6' 3 (1.905 m)   Wt (!) 362 lb 12.8 oz (164.6 kg)   SpO2 96%   BMI 45.35 kg/m   GEN: Pleasant, interactive, well-appearing; morbidly obese; in no acute distress HEENT:  Normocephalic and atraumatic. PERRLA. Sclera white. Nasal turbinates pink, moist and patent bilaterally. No rhinorrhea present. Oropharynx pink and moist, without exudate or edema. No lesions, ulcerations, or postnasal drip. Mallampati IV. Enlarged tonsils.  NECK:  Supple w/ fair ROM. No JVD present. Thyroid symmetrical with no goiter or nodules palpated. No lymphadenopathy.   CV: RRR, no m/r/g, no peripheral edema. Pulses intact, +2 bilaterally. No cyanosis, pallor or clubbing. PULMONARY:  Unlabored, regular breathing. Clear bilaterally A&P w/o  wheezes/rales/rhonchi. No accessory muscle use.  GI: BS present and normoactive. Soft, non-tender to palpation. No organomegaly or masses detected.  MSK: No erythema, warmth or tenderness. Cap refil <2 sec all extrem.  Neuro: A/Ox3. No focal deficits noted.   Skin: Warm, no lesions or rashe Psych: Normal affect and behavior. Judgement and thought content appropriate.     Lab Results:  CBC    Component Value Date/Time   WBC 6.7 04/26/2024 1015   WBC 7.2 06/25/2010 0500   RBC 5.71 04/26/2024 1015   RBC 4.83 06/25/2010 0500   HGB 16.5 04/26/2024 1015   HCT 49.4 04/26/2024 1015   PLT 208 04/26/2024 1015   MCV 87 04/26/2024 1015   MCH 28.9 04/26/2024 1015   MCH 29.8 06/25/2010 0500   MCHC 33.4 04/26/2024 1015   MCHC 33.3 06/25/2010 0500   RDW 14.8 04/26/2024 1015   LYMPHSABS 2.6 04/26/2024 1015   MONOABS 0.7 06/24/2010 0446   EOSABS 0.2 04/26/2024 1015   BASOSABS 0.1 04/26/2024 1015    BMET    Component Value Date/Time   NA 140 04/26/2024 1015   K 4.1 04/26/2024 1015   CL 104 04/26/2024 1015  CO2 22 04/26/2024 1015   GLUCOSE 112 (H) 04/26/2024 1015   GLUCOSE 73 06/25/2010 0500   BUN 11 04/26/2024 1015   CREATININE 0.65 (L) 04/26/2024 1015   CALCIUM 9.0 04/26/2024 1015   GFRNONAA >60 06/25/2010 0500   GFRAA  06/25/2010 0500    >60        The eGFR has been calculated using the MDRD equation. This calculation has not been validated in all clinical situations. eGFR's persistently <60 mL/min signify possible Chronic Kidney Disease.    BNP No results found for: BNP   Imaging:  Sleep Study Documents Result Date: 06/29/2024 Ordered by an unspecified provider.   Administration History     None           No data to display          No results found for: NITRICOXIDE      Assessment & Plan:   OSA (obstructive sleep apnea) Very severe OSA. Inadequate control on CPAP. Transitioned to BiPAP with residual mild evens. Will place urgent order  for BiPAP auto 25/21, PS 4, medium AirFit P30i mask, heated humidity. Educated on proper care/use of device. Risk/benefits reviewed. Understands risks of untreated severe OSA and potential treatment options. Safe driving practices reviewed. Healthy weight loss encouraged.   Patient Instructions  Start BiPAP every night, minimum of 4-6 hours a night.  Change equipment as directed. Wash your tubing with warm soap and water daily, hang to dry. Wash humidifier portion weekly. Use bottled, distilled water and change daily Be aware of reduced alertness and do not drive or operate heavy machinery if experiencing this or drowsiness.  Exercise encouraged, as tolerated. Healthy weight management discussed.  Avoid or decrease alcohol consumption and medications that make you more sleepy, if possible. Notify if persistent daytime sleepiness occurs even with consistent use of PAP therapy.  Change BiPAP supplies... Every month Mask cushions and/or nasal pillows CPAP machine filters Every 3 months Mask frame (not including the headgear) CPAP tubing Every 6 months Mask headgear Chin strap (if applicable) Humidifier water tub  We discussed how untreated sleep apnea puts an individual at risk for cardiac arrhthymias, pulm HTN, DM, stroke and increases their risk for daytime accidents.   Follow up in 10-12 weeks with Katie Melody Savidge,NP. Virtual Friday PM clinic, if ok with pt. Or sooner, if needed   BMI 45.0-49.9, adult (HCC) BMI 45. Healthy weight loss encouraged    Advised if symptoms do not improve or worsen, to please contact office for sooner follow up or seek emergency care.   I spent 35 minutes of dedicated to the care of this patient on the date of this encounter to include pre-visit review of records, face-to-face time with the patient discussing conditions above, post visit ordering of testing, clinical documentation with the electronic health record, making appropriate referrals as documented,  and communicating necessary findings to members of the patients care team.  Comer LULLA Rouleau, NP 07/25/2024  Pt aware and understands NP's role.

## 2024-07-25 NOTE — Assessment & Plan Note (Signed)
 Very severe OSA. Inadequate control on CPAP. Transitioned to BiPAP with residual mild evens. Will place urgent order for BiPAP auto 25/21, PS 4, medium AirFit P30i mask, heated humidity. Educated on proper care/use of device. Risk/benefits reviewed. Understands risks of untreated severe OSA and potential treatment options. Safe driving practices reviewed. Healthy weight loss encouraged.   Patient Instructions  Start BiPAP every night, minimum of 4-6 hours a night.  Change equipment as directed. Wash your tubing with warm soap and water daily, hang to dry. Wash humidifier portion weekly. Use bottled, distilled water and change daily Be aware of reduced alertness and do not drive or operate heavy machinery if experiencing this or drowsiness.  Exercise encouraged, as tolerated. Healthy weight management discussed.  Avoid or decrease alcohol consumption and medications that make you more sleepy, if possible. Notify if persistent daytime sleepiness occurs even with consistent use of PAP therapy.  Change BiPAP supplies... Every month Mask cushions and/or nasal pillows CPAP machine filters Every 3 months Mask frame (not including the headgear) CPAP tubing Every 6 months Mask headgear Chin strap (if applicable) Humidifier water tub  We discussed how untreated sleep apnea puts an individual at risk for cardiac arrhthymias, pulm HTN, DM, stroke and increases their risk for daytime accidents.   Follow up in 10-12 weeks with Katie Connor Meacham,NP. Virtual Friday PM clinic, if ok with pt. Or sooner, if needed

## 2024-07-25 NOTE — Assessment & Plan Note (Signed)
BMI 45. Healthy weight loss encouraged.  

## 2024-07-25 NOTE — Patient Instructions (Addendum)
 Start BiPAP every night, minimum of 4-6 hours a night.  Change equipment as directed. Wash your tubing with warm soap and water daily, hang to dry. Wash humidifier portion weekly. Use bottled, distilled water and change daily Be aware of reduced alertness and do not drive or operate heavy machinery if experiencing this or drowsiness.  Exercise encouraged, as tolerated. Healthy weight management discussed.  Avoid or decrease alcohol consumption and medications that make you more sleepy, if possible. Notify if persistent daytime sleepiness occurs even with consistent use of PAP therapy.  Change BiPAP supplies... Every month Mask cushions and/or nasal pillows CPAP machine filters Every 3 months Mask frame (not including the headgear) CPAP tubing Every 6 months Mask headgear Chin strap (if applicable) Humidifier water tub  We discussed how untreated sleep apnea puts an individual at risk for cardiac arrhthymias, pulm HTN, DM, stroke and increases their risk for daytime accidents.   Follow up in 10-12 weeks with Katie Taijon Vink,NP. Virtual Friday PM clinic, if ok with pt. Or sooner, if needed

## 2024-07-26 ENCOUNTER — Ambulatory Visit (HOSPITAL_BASED_OUTPATIENT_CLINIC_OR_DEPARTMENT_OTHER): Admitting: Student

## 2024-07-27 ENCOUNTER — Encounter (HOSPITAL_BASED_OUTPATIENT_CLINIC_OR_DEPARTMENT_OTHER): Payer: Self-pay | Admitting: Student

## 2024-07-27 ENCOUNTER — Ambulatory Visit (HOSPITAL_BASED_OUTPATIENT_CLINIC_OR_DEPARTMENT_OTHER): Admitting: Student

## 2024-07-27 VITALS — BP 127/86 | HR 79 | Temp 97.9°F | Resp 16 | Ht 75.0 in | Wt 361.5 lb

## 2024-07-27 DIAGNOSIS — R7309 Other abnormal glucose: Secondary | ICD-10-CM | POA: Diagnosis not present

## 2024-07-27 DIAGNOSIS — R7401 Elevation of levels of liver transaminase levels: Secondary | ICD-10-CM | POA: Diagnosis not present

## 2024-07-27 DIAGNOSIS — Z6841 Body Mass Index (BMI) 40.0 and over, adult: Secondary | ICD-10-CM

## 2024-07-27 DIAGNOSIS — Z131 Encounter for screening for diabetes mellitus: Secondary | ICD-10-CM | POA: Diagnosis not present

## 2024-07-27 LAB — POCT GLUCOSE (DEVICE FOR HOME USE): POC Glucose: 117 mg/dL — AB (ref 70–99)

## 2024-07-27 NOTE — Patient Instructions (Signed)
 It was nice to see you today!  As we discussed in clinic:  Please call to have the heart monitor placed.  We will check your liver enzymes and likely get a Liver Ultrasound after this.  If you have any problems before your next visit feel free to message me via MyChart (minor issues or questions) or call the office, otherwise you may reach out to schedule an office visit.  Thank you! Lestat Golob, PA-C

## 2024-07-27 NOTE — Progress Notes (Signed)
 Established Patient Office Visit  Subjective   Patient ID: Jack Cabrera, male    DOB: 07-Jan-1986  Age: 38 y.o. MRN: [id number]  Chief Complaint  Patient presents with   Medical Management of Chronic Issues    Follow up. Doing keto diet.      HPI  Discussed the use of AI scribe software for clinical note transcription with the patient, who gave verbal consent to proceed.  History of Present Illness   Jack Cabrera is a 38 year old male with diabetes and sleep apnea who presents for follow-up on his weight loss and glucose management.  He has experienced significant weight loss after adopting a ketogenic diet, which he finds effective but monotonous. He has eliminated sugar and carbohydrates to manage his diabetes, typically consuming two meals a day consisting of steak and eggs, and fasting until dinner. He notes improved energy levels, feeling less tired, and no longer needing afternoon naps.  He has a history of severe obstructive sleep apnea with 128 episodes per hour and is awaiting a CPAP machine.  He mentions a past episode of pancreatitis and gallstones in 2011, which may have contributed to elevated liver enzymes. He has been using herbal remedies, including cayenne pepper and olive oil mixtures, to detoxify his liver. He reports a history of elevated liver enzymes, with recent levels ranging from the high 90s to 130.  He is currently on two medications for blood pressure, which he takes in the morning, and has recently refilled his losartan  prescription. He is also using apple cider vinegar and herbal teas to manage his blood pressure. No recent swelling and he reports feeling much better after dietary changes. He drinks water, black coffee, and occasionally diet soda, and has switched to goat cheese to reduce fat intake. He takes a shot of olive oil with peppers in the morning to aid digestion.          ROS Per HPI.    Objective:     BP 127/86   Pulse 79   Temp  97.9 F (36.6 C) (Oral)   Resp 16   Ht 6' 3 (1.905 m)   Wt (!) 361 lb 8 oz (164 kg)   SpO2 96%   BMI 45.18 kg/m     Physical Exam Constitutional:      General: He is not in acute distress.    Appearance: Normal appearance. He is not ill-appearing.  HENT:     Head: Normocephalic and atraumatic.     Right Ear: External ear normal.     Left Ear: External ear normal.     Nose: Nose normal.  Eyes:     Conjunctiva/sclera: Conjunctivae normal.  Cardiovascular:     Rate and Rhythm: Normal rate and regular rhythm.     Pulses: Normal pulses.     Heart sounds: Normal heart sounds. No murmur heard.    No friction rub.  Pulmonary:     Effort: Pulmonary effort is normal. No respiratory distress.     Breath sounds: Normal breath sounds. No wheezing, rhonchi or rales.  Skin:    General: Skin is warm and dry.     Coloration: Skin is not jaundiced or pale.  Neurological:     Mental Status: He is alert.  Psychiatric:        Mood and Affect: Mood normal.        Behavior: Behavior normal.      Results for orders placed or performed in visit on 07/27/24  POCT Glucose (Device for Home Use)  Result Value Ref Range   Glucose Fasting, POC     POC Glucose 117 (A) 70 - 99 mg/dl       The ASCVD Risk score (Arnett DK, et al., 2019) failed to calculate for the following reasons:   Unable to determine if patient is Non-Hispanic African American    Assessment & Plan:   Assessment and Plan    Morbid obesity BMI of 45.18 today. Working on [employer]. - Continue working on diet, due to carnivore component we discussed concern for elevated cholesterol. - Will recheck cholesterol in 3 months. Do not feel this is necessary today.   Elevated A1c Managed with ketogenic diet, reporting improved energy and no postprandial fatigue. Current glucose levels to be assessed due to recent food intake. Still need to get a fasting glucose on him but suspected T2DM- need some sort of confirmatory test. -  Order random glucose test to assess current glucose levels  Elevated liver enzymes with suspected fatty liver disease Mildly elevated liver enzymes, likely due to suspected fatty liver disease. - Order hepatic function panel to reassess liver enzyme levels - If liver enzymes remain elevated, order liver ultrasound - Calculate fib-4 score to assess risk of liver fibrosis- low risk  Essential hypertension Chronic, stable. Managed with medication and exploring lifestyle modifications, including dietary changes and herbal remedies. Blood pressure readings are well-controlled. - Continue current antihypertensive medications - Discuss option of switching to a combination pill to reduce pill burden if needed  Palpitations with premature ventricular contractions (PVCs) Missed previous heart monitor appointment. Decision to restart Focalin  contingent on heart monitor results. Referral to cardiology considered if PVC burden is significant, especially if PVCs exceed 15% of heartbeats. May be in part due to untreated OSA. - Instruct to reschedule heart monitor appointment - Evaluate heart monitor results before restarting Focalin  - Consider cardiology referral if PVC burden is significant  Obstructive sleep apnea Chronic, supposed to be set up with CPAP soon. Severe obstructive sleep apnea with 128 episodes per hour.      Return in about 6 months (around 01/24/2025) for Chronic Followup.    Ailin Rochford T Nyshaun Standage, PA-C

## 2024-07-28 LAB — HEPATIC FUNCTION PANEL
ALT: 32 IU/L (ref 0–44)
AST: 24 IU/L (ref 0–40)
Albumin: 4.4 g/dL (ref 4.1–5.1)
Alkaline Phosphatase: 81 IU/L (ref 44–121)
Bilirubin Total: 0.4 mg/dL (ref 0.0–1.2)
Bilirubin, Direct: 0.15 mg/dL (ref 0.00–0.40)
Total Protein: 6.9 g/dL (ref 6.0–8.5)

## 2024-07-30 ENCOUNTER — Ambulatory Visit (HOSPITAL_BASED_OUTPATIENT_CLINIC_OR_DEPARTMENT_OTHER): Payer: Self-pay | Admitting: Student

## 2024-08-02 ENCOUNTER — Telehealth: Payer: Self-pay

## 2024-08-02 NOTE — Telephone Encounter (Signed)
 Received CMN from [facility] Katie signed and faxed it to [phone] received conformation

## 2024-08-13 ENCOUNTER — Ambulatory Visit (HOSPITAL_BASED_OUTPATIENT_CLINIC_OR_DEPARTMENT_OTHER): Admitting: Student

## 2024-08-21 ENCOUNTER — Ambulatory Visit: Payer: Self-pay

## 2024-08-21 NOTE — Telephone Encounter (Signed)
 FYI Only or Action Required?: FYI only for provider.  Patient was last seen in primary care on 07/27/2024 by Rothfuss, Lang DASEN, PA-C.  Called Nurse Triage reporting Back Pain.  Symptoms began several months ago.  Interventions attempted: OTC medications: tylenol and ibuorofen.  Symptoms are: gradually worsening.  Triage Disposition: See PCP When Office is Open (Within 3 Days)  Patient/caregiver understands and will follow disposition?: Yes Reason for Disposition  [1] MODERATE back pain (e.g., interferes with normal activities) AND [2] present > 3 days  [1] Pain radiates into the thigh or further down the leg AND [2] one leg  Answer Assessment - Initial Assessment Questions Pts wife reports 5+ month hx of atraumatic back pain.   1. ONSET: When did the pain begin? (e.g., minutes, hours, days)     6+ months  2. LOCATION: Where does it hurt? (upper, mid or lower back)     Lower back, radiates to right leg  3. SEVERITY: How bad is the pain?  (e.g., Scale 1-10; mild, moderate, or severe)     Patient's wife believes it is severe  4. PATTERN: Is the pain constant? (e.g., yes, no; constant, intermittent)      Constant  5. RADIATION: Does the pain shoot into your legs or somewhere else?     To right leg  6. CAUSE:  What do you think is causing the back pain?      Unknown, pt believes it could be sciatica  7. BACK OVERUSE:       Heavy lifting at work/plumber  8. MEDICINES: What have you taken so far for the pain? (e.g., nothing, acetaminophen, NSAIDS)     Tylenol or ibuprofen  9. NEUROLOGIC SYMPTOMS: Do you have any weakness, numbness, or problems with bowel/bladder control?     Side of thighs are occasionally tingly  Protocols used: Back Pain-A-AH Copied from CRM #[id number]. Topic: Clinical - Red Word Triage >> Aug 21, 2024  3:40 PM Jack Cabrera wrote: Red Word that prompted transfer to Nurse Triage: Pain in patients back, interfering with his job and wishes to be  soon asap.

## 2024-08-22 ENCOUNTER — Ambulatory Visit (HOSPITAL_BASED_OUTPATIENT_CLINIC_OR_DEPARTMENT_OTHER): Admitting: Student

## 2024-08-23 ENCOUNTER — Encounter (HOSPITAL_BASED_OUTPATIENT_CLINIC_OR_DEPARTMENT_OTHER): Payer: Self-pay | Admitting: Student

## 2024-08-23 ENCOUNTER — Ambulatory Visit (HOSPITAL_BASED_OUTPATIENT_CLINIC_OR_DEPARTMENT_OTHER): Admitting: Student

## 2024-08-23 VITALS — BP 103/71 | HR 99 | Temp 98.3°F | Resp 16 | Ht 75.0 in | Wt 347.9 lb

## 2024-08-23 DIAGNOSIS — M5441 Lumbago with sciatica, right side: Secondary | ICD-10-CM | POA: Diagnosis not present

## 2024-08-23 DIAGNOSIS — Z2239 Carrier of other specified bacterial diseases: Secondary | ICD-10-CM | POA: Diagnosis not present

## 2024-08-23 DIAGNOSIS — I10 Essential (primary) hypertension: Secondary | ICD-10-CM

## 2024-08-23 MED ORDER — MELOXICAM 15 MG PO TABS: 15.0000 mg | ORAL_TABLET | Freq: Every day | ORAL | 0 refills | Status: AC

## 2024-08-23 MED ORDER — LOSARTAN POTASSIUM 25 MG PO TABS: 25.0000 mg | ORAL_TABLET | Freq: Every day | ORAL | 5 refills | Status: AC

## 2024-08-23 MED ORDER — DOXYCYCLINE HYCLATE 100 MG PO TABS: 100.0000 mg | ORAL_TABLET | Freq: Two times a day (BID) | ORAL | 0 refills | Status: AC

## 2024-08-23 NOTE — Progress Notes (Signed)
 Established Patient Office Visit  Subjective   Patient ID: Jack Cabrera, male    DOB: 09-Nov-1986  Age: 38 y.o. MRN: [id number]  Chief Complaint  Patient presents with   Back Pain    Chronic back pain. Now he is having shoot pains down right leg muscle. Pain is not easing up. Muscle is locked.    Urine problem    Wife had UTI. Urologist gave her doxycycline. He was advised to get a prescription as well to prevent exchanging bacterial infection back and forth.    Discussed the use of AI scribe software for clinical note transcription with the patient, who gave verbal consent to proceed.  History of Present Illness   Jack Cabrera is a 38 year old male who presents with back pain radiating down the leg.  He has been experiencing back pain radiating down his leg for approximately one month. The pain is described as a shooting, electric-type sensation, akin to a 'pressed nerve and sciatica,' and is more pronounced along the edge of his back rather than the center. Movement or standing exacerbates the pain, and he notes a persistent tightness and 'locked position' of the muscle. He has been taking ibuprofen occasionally and is on call 24/7 for work. He avoids opioids and strong muscle relaxers due to their sedative effects.  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 No current urinary symptoms.  He has a history of premature ventricular contractions (PVCs) and has made dietary and lifestyle changes that have improved his condition. He has been trying to reschedule a long-term heart monitor but has had difficulty contacting the office. He recently started using a sleep apnea machine. His blood pressure and blood sugar levels have improved significantly, and he has adjusted his losartan  dosage accordingly.  He is on call 24/7 for work and avoids alcohol and strong medications due to  their sedative effects. He has changed his diet and lifestyle to improve his health, including reducing sugar and carbohydrate intake.      Patient Active Problem List   Diagnosis Date Noted   Elevated ALT measurement 07/27/2024   Elevated hemoglobin A1c 07/27/2024   Difficulty sleeping 05/28/2024   PVC (premature ventricular contraction) 05/28/2024   Prediabetes 05/28/2024   Enlarged tonsils 05/10/2024   Excessive daytime sleepiness 05/10/2024   Adult general medical exam 04/26/2024   BMI 45.0-49.9, adult (HCC) 04/26/2024   Morbid obesity (HCC) 04/26/2024   OSA (obstructive sleep apnea) 03/14/2024   Nicotine abuse 03/14/2024   Primary hypertension 03/14/2024   Attention deficit hyperactivity disorder (ADHD), predominantly hyperactive type 03/14/2024   History of acute pancreatitis 03/14/2024   Past Medical History:  Diagnosis Date   Hypertension    Obesity    Social History   Tobacco Use   Smoking status: Some Days    Current packs/day: 0.25    Average packs/day: 0.3 packs/day for 23.8 years (5.9 ttl pk-yrs)    Types: Cigarettes    Start date: 2002    Passive exposure: Current   Tobacco comments:    1 pack per month 05/09/2024 KRD  Vaping Use   Vaping status: Every Day   Substances: THC   Devices: delta 9  Substance Use Topics   Alcohol use: Not Currently    Comment: QUIT AROUND 06/2924   Drug use: Yes    Comment: THC, delta-9, some days   No Known Allergies  Review of Systems  Musculoskeletal:  Positive for back pain.   Per HPI.    Objective:     BP 103/71   Pulse 99   Temp 98.3 F (36.8 C) (Oral)   Resp 16   Ht 6' 3 (1.905 m)   Wt (!) 347 lb 14.4 oz (157.8 kg)   SpO2 97%   BMI 43.48 kg/m  BP Readings from Last 3 Encounters:  08/23/24 103/71  07/27/24 127/86  07/25/24 (!) 143/85   Wt Readings from Last 3 Encounters:  08/23/24 (!) 347 lb 14.4 oz (157.8 kg)  07/27/24 (!) 361 lb 8 oz (164 kg)  07/25/24 (!) 362 lb 12.8 oz (164.6 kg)       Physical Exam Constitutional:      General: He is not in acute distress.    Appearance: Normal appearance. He is not ill-appearing.  HENT:     Head: Normocephalic and atraumatic.     Right Ear: External ear normal.     Left Ear: External ear normal.     Nose: Nose normal.  Eyes:     Conjunctiva/sclera: Conjunctivae normal.  Cardiovascular:     Rate and Rhythm: Normal rate and regular rhythm.     Pulses: Normal pulses.     Heart sounds: Normal heart sounds. No murmur heard.    No friction rub.  Pulmonary:     Effort: Pulmonary effort is normal. No respiratory distress.     Breath sounds: Normal breath sounds. No wheezing, rhonchi or rales.  Musculoskeletal:     Comments: SLR negative bilateral.  Right sided paraspinous muscles tender to palpation. No R sided CVA tenderness.  Skin:    General: Skin is warm and dry.     Coloration: Skin is not jaundiced or pale.  Neurological:     Mental Status: He is alert.  Psychiatric:        Mood and Affect: Mood normal.        Behavior: Behavior normal.      No results found for any visits on 08/23/24.  Last CBC Lab Results  Component Value Date   WBC 6.7 04/26/2024   HGB 16.5 04/26/2024   HCT 49.4 04/26/2024   MCV 87 04/26/2024   MCH 28.9 04/26/2024   RDW 14.8 04/26/2024   PLT 208 04/26/2024   Last metabolic panel Lab Results  Component Value Date   GLUCOSE 112 (H) 04/26/2024   NA 140 04/26/2024   K 4.1 04/26/2024   CL 104 04/26/2024   CO2 22 04/26/2024   BUN 11 04/26/2024   CREATININE 0.65 (L) 04/26/2024   EGFR 123 04/26/2024   CALCIUM 9.0 04/26/2024   PROT 6.9 07/27/2024   ALBUMIN 4.4 07/27/2024   LABGLOB 2.3 04/26/2024   BILITOT 0.4 07/27/2024   ALKPHOS 81 07/27/2024   AST 24 07/27/2024   ALT 32 07/27/2024   Last lipids Lab Results  Component Value Date   CHOL 162 04/26/2024   HDL 31 (L) 04/26/2024   LDLCALC 104 (H) 04/26/2024   TRIG 152 (H) 04/26/2024   CHOLHDL 5.2 (H) 04/26/2024   Last  hemoglobin A1c Lab Results  Component Value Date   HGBA1C 6.8 (H) 04/26/2024      The ASCVD Risk score (Arnett DK, et al., 2019) failed to calculate for the following reasons:   Unable to determine if patient is Non-Hispanic African American    Assessment & Plan:   Assessment and Plan    Low back pain with right-sided sciatica Chronic  low back pain with right-sided sciatica for approximately one month. Pain is localized from the lower back down the right leg, with a tight muscle and shooting, electric-like pain. Pain exacerbated by movement and standing, possibly related to cessation of pool exercise. No alarm signs noted. - Prescribe meloxicam daily for two weeks. - Advise to stop ibuprofen and aspirin while on meloxicam. - Recommend taking meloxicam with food. - Allow use of acetaminophen for additional pain relief if needed. - Suggest stretching exercises for the lower back, holding stretches for up to 30 seconds. - Recommend icing the area for 15 minutes on and 15 minutes off. - Consider physical therapy if pain persists after two weeks.  Hypertension Chronic, side effect of hypotension noted. Hypertension is well-controlled with current lifestyle modifications, including dietary changes and reduced alcohol intake. Blood pressure readings are good, and blood sugar levels are stable. - Prescribe losartan  25 mg and discontinue losartan  50 mg, since per patient, blood pressure consistently drops below 100 mmHg.  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 No current urinary symptoms reported. Decision to treat prophylactically with doxycycline due to potential asymptomatic carriage and transmission risk. - Prescribe doxycycline twice daily for ten days. - Discuss potential side effect of doxycycline, including increased sensitivity to  sunlight.      Return if symptoms worsen or fail to improve.    Talha Iser T Sylvestre Rathgeber, PA-C

## 2024-08-23 NOTE — Patient Instructions (Signed)
 It was nice to see you today!  If you have any problems before your next visit feel free to message me via MyChart (minor issues or questions) or call the office, otherwise you may reach out to schedule an office visit.  Thank you! Pau Banh, PA-C

## 2024-10-05 ENCOUNTER — Ambulatory Visit: Admitting: Nurse Practitioner

## 2024-10-05 ENCOUNTER — Telehealth: Payer: Self-pay

## 2024-10-05 NOTE — Telephone Encounter (Signed)
 Called patient earlier and offered earlier appointment.  Patient agreed for an 11:00 am video visit but did not log into EPIC for virtual appointment.  Left message on VM and called patient x 2 for patient to start video visit with Izetta Rouleau, NP today.

## 2024-10-09 ENCOUNTER — Encounter (HOSPITAL_COMMUNITY): Payer: Self-pay

## 2024-10-09 ENCOUNTER — Ambulatory Visit (HOSPITAL_COMMUNITY)
Admission: EM | Admit: 2024-10-09 | Discharge: 2024-10-09 | Disposition: A | Discharge status: Home / Self Care | Attending: Physician Assistant | Admitting: Physician Assistant

## 2024-10-09 DIAGNOSIS — J029 Acute pharyngitis, unspecified: Secondary | ICD-10-CM | POA: Diagnosis not present

## 2024-10-09 LAB — POCT RAPID STREP A (OFFICE): Rapid Strep A Screen: NEGATIVE

## 2024-10-09 LAB — POCT INFLUENZA A/B
Influenza A, POC: NEGATIVE
Influenza B, POC: NEGATIVE

## 2024-10-09 LAB — POC SOFIA SARS ANTIGEN FIA: SARS Coronavirus 2 Ag: NEGATIVE

## 2024-10-09 MED ORDER — AMOXICILLIN 500 MG PO CAPS: 500.0000 mg | ORAL_CAPSULE | Freq: Three times a day (TID) | ORAL | 0 refills | Status: AC

## 2024-10-09 NOTE — ED Provider Notes (Signed)
 [facility]    CSN: [id number] Arrival date & time: 10/09/24  1246      History   Chief Complaint No chief complaint on file.   HPI Jack Cabrera is a 38 y.o. male.   Pt complains of a sore throat.  Pt reports he has 2 children that tested positive for strep.  Pt complains of pain with swallowing.  Pt denies cough or congestion.    The history is provided by the patient. No language interpreter was used.    Past Medical History:  Diagnosis Date   Hypertension    Obesity     Patient Active Problem List   Diagnosis Date Noted   Elevated ALT measurement 07/27/2024   Elevated hemoglobin A1c 07/27/2024   Difficulty sleeping 05/28/2024   PVC (premature ventricular contraction) 05/28/2024   Prediabetes 05/28/2024   Enlarged tonsils 05/10/2024   Excessive daytime sleepiness 05/10/2024   Adult general medical exam 04/26/2024   BMI 45.0-49.9, adult (HCC) 04/26/2024   Morbid obesity (HCC) 04/26/2024   OSA (obstructive sleep apnea) 03/14/2024   Nicotine abuse 03/14/2024   Primary hypertension 03/14/2024   Attention deficit hyperactivity disorder (ADHD), predominantly hyperactive type 03/14/2024   History of acute pancreatitis 03/14/2024    Past Surgical History:  Procedure Laterality Date   CHOLECYSTECTOMY         Home Medications    Prior to Admission medications   Medication Sig Start Date End Date Taking? Authorizing Provider  amoxicillin (AMOXIL) 500 MG capsule Take 1 capsule (500 mg total) by mouth 3 (three) times daily. 10/09/24  Yes Cheral Cappucci K, PA-C  hydrochlorothiazide  (HYDRODIURIL ) 12.5 MG tablet Take 1 tablet (12.5 mg total) by mouth daily. May stat with a half tablet for the first 2 weeks to see if you will tolerate it. 05/28/24  Yes Rothfuss, Jacob T, PA-C  losartan  (COZAAR ) 25 MG tablet Take 1 tablet (25 mg total) by mouth daily. 08/23/24  Yes Rothfuss, Jacob T, PA-C  meloxicam (MOBIC) 15 MG tablet Take 1 tablet (15 mg total) by mouth  daily. 08/23/24  Yes Rothfuss, Jacob T, PA-C  UNABLE TO FIND Med Name: drinks cayenne pepper, olive oil, ginger, etc. & Aloe vera.   Yes [provider]    Family History Family History  Problem Relation Age of Onset   Breast cancer Mother        spread   Dementia Father    Hypertension Maternal Grandfather    Diabetes Paternal Grandmother     Social History Social History   Tobacco Use   Smoking status: Some Days    Current packs/day: 0.25    Average packs/day: 0.3 packs/day for 23.9 years (6.0 ttl pk-yrs)    Types: Cigarettes    Start date: 2002    Passive exposure: Current   Tobacco comments:    1 pack per month 05/09/2024 KRD  Vaping Use   Vaping status: Every Day   Substances: THC   Devices: delta 9  Substance Use Topics   Alcohol use: Not Currently    Comment: QUIT AROUND 06/2924   Drug use: Yes    Comment: THC, delta-9, some days     Allergies   Patient has no known allergies.   Review of Systems Review of Systems  All other systems reviewed and are negative.    Physical Exam Triage Vital Signs ED Triage Vitals [10/09/24 1352]  Encounter Vitals Group     BP (!) 140/80     Girls Systolic BP  Percentile      Girls Diastolic BP Percentile      Boys Systolic BP Percentile      Boys Diastolic BP Percentile      Pulse Rate 78     Resp 18     Temp 99.9 F (37.7 C)     Temp Source Oral     SpO2 97 %     Weight      Height      Head Circumference      Peak Flow      Pain Score      Pain Loc      Pain Education      Exclude from Growth Chart    No data found.  Updated Vital Signs BP (!) 140/80 (BP Location: Left Arm)   Pulse 78   Temp 99.9 F (37.7 C) (Oral)   Resp 18   SpO2 97%   Visual Acuity Right Eye Distance:   Left Eye Distance:   Bilateral Distance:    Right Eye Near:   Left Eye Near:    Bilateral Near:     Physical Exam Vitals and nursing note reviewed.  Constitutional:      Appearance: He is well-developed.   HENT:     Head: Normocephalic.     Mouth/Throat:     Pharynx: Oropharyngeal exudate and posterior oropharyngeal erythema present.  Cardiovascular:     Rate and Rhythm: Normal rate.  Pulmonary:     Effort: Pulmonary effort is normal.  Abdominal:     General: There is no distension.  Musculoskeletal:        General: Normal range of motion.  Skin:    General: Skin is warm.  Neurological:     General: No focal deficit present.     Mental Status: He is alert and oriented to person, place, and time.  Psychiatric:        Mood and Affect: Mood normal.      UC Treatments / Results  Labs (all labs ordered are listed, but only abnormal results are displayed) Labs Reviewed  POCT RAPID STREP A (OFFICE)  POC Sten Dematteo SARS ANTIGEN FIA  POCT INFLUENZA A/B    EKG   Radiology No results found.  Procedures Procedures (including critical care time)  Medications Ordered in UC Medications - No data to display  Initial Impression / Assessment and Plan / UC Course  I have reviewed the triage vital signs and the nursing notes.  Pertinent labs & imaging results that were available during my care of the patient were reviewed by me and considered in my medical decision making (see chart for details).     Strep screen is negative.  Patient has 2 children who have strep.  Patient chooses to be treated with antibiotics patient given prescription for amoxicillin.  Discharged in stable condition. Final Clinical Impressions(s) / UC Diagnoses   Final diagnoses:  Acute pharyngitis, unspecified etiology     Discharge Instructions      Return if any problems.    ED Prescriptions     Medication Sig Dispense Auth. Provider   amoxicillin (AMOXIL) 500 MG capsule Take 1 capsule (500 mg total) by mouth 3 (three) times daily. 30 capsule Faust Thorington K, PA-C      PDMP not reviewed this encounter. An After Visit Summary was printed and given to the patient.       Flint Sonny POUR,  PA-C 10/09/24 1440

## 2024-10-09 NOTE — ED Triage Notes (Signed)
 Patient presents to the office for sore throat.fatigue and nasal congestion x 3 days. Daughter have strep and pink eye.

## 2024-10-09 NOTE — Discharge Instructions (Addendum)
 Return if any problems.

## 2024-12-05 ENCOUNTER — Other Ambulatory Visit (HOSPITAL_BASED_OUTPATIENT_CLINIC_OR_DEPARTMENT_OTHER): Payer: Self-pay | Admitting: Student

## 2024-12-05 DIAGNOSIS — I10 Essential (primary) hypertension: Secondary | ICD-10-CM

## 2024-12-13 ENCOUNTER — Ambulatory Visit (HOSPITAL_BASED_OUTPATIENT_CLINIC_OR_DEPARTMENT_OTHER): Admitting: Family Medicine
